# Patient Record
Sex: Male | Born: 1945 | Race: White | Hispanic: No | Marital: Single | Smoking: Former smoker
Health system: Southern US, Community
[De-identification: ages and names within clinical notes are randomized; demographics above are authoritative.]

## PROBLEM LIST (undated history)

## (undated) DIAGNOSIS — I779 Disorder of arteries and arterioles, unspecified: Secondary | ICD-10-CM

## (undated) DIAGNOSIS — Z923 Personal history of irradiation: Secondary | ICD-10-CM

## (undated) DIAGNOSIS — I714 Abdominal aortic aneurysm, without rupture, unspecified: Secondary | ICD-10-CM

## (undated) DIAGNOSIS — C61 Malignant neoplasm of prostate: Secondary | ICD-10-CM

## (undated) DIAGNOSIS — I739 Peripheral vascular disease, unspecified: Secondary | ICD-10-CM

## (undated) DIAGNOSIS — Z9889 Other specified postprocedural states: Secondary | ICD-10-CM

## (undated) DIAGNOSIS — Z720 Tobacco use: Secondary | ICD-10-CM

## (undated) DIAGNOSIS — Z8679 Personal history of other diseases of the circulatory system: Secondary | ICD-10-CM

## (undated) DIAGNOSIS — Z8619 Personal history of other infectious and parasitic diseases: Secondary | ICD-10-CM

## (undated) DIAGNOSIS — Z9289 Personal history of other medical treatment: Secondary | ICD-10-CM

## (undated) DIAGNOSIS — I10 Essential (primary) hypertension: Secondary | ICD-10-CM

## (undated) DIAGNOSIS — E785 Hyperlipidemia, unspecified: Secondary | ICD-10-CM

## (undated) DIAGNOSIS — I2510 Atherosclerotic heart disease of native coronary artery without angina pectoris: Secondary | ICD-10-CM

## (undated) DIAGNOSIS — G709 Myoneural disorder, unspecified: Secondary | ICD-10-CM

## (undated) DIAGNOSIS — I219 Acute myocardial infarction, unspecified: Secondary | ICD-10-CM

## (undated) HISTORY — DX: Abdominal aortic aneurysm, without rupture, unspecified: I71.40

## (undated) HISTORY — DX: Myoneural disorder, unspecified: G70.9

## (undated) HISTORY — DX: Hyperlipidemia, unspecified: E78.5

## (undated) HISTORY — DX: Abdominal aortic aneurysm, without rupture: I71.4

## (undated) HISTORY — PX: OTHER SURGICAL HISTORY: SHX169

## (undated) HISTORY — PX: HERNIA REPAIR: SHX51

## (undated) HISTORY — DX: Personal history of other medical treatment: Z92.89

## (undated) HISTORY — PX: INGUINAL HERNIA REPAIR: SUR1180

## (undated) HISTORY — PX: CHOLECYSTECTOMY: SHX55

## (undated) HISTORY — DX: Malignant neoplasm of prostate: C61

## (undated) HISTORY — DX: Personal history of irradiation: Z92.3

## (undated) HISTORY — DX: Acute myocardial infarction, unspecified: I21.9

## (undated) HISTORY — DX: Essential (primary) hypertension: I10

## (undated) HISTORY — DX: Tobacco use: Z72.0

## (undated) HISTORY — DX: Atherosclerotic heart disease of native coronary artery without angina pectoris: I25.10

## (undated) HISTORY — PX: CAROTID ENDARTERECTOMY: SUR193

## (undated) HISTORY — DX: Disorder of arteries and arterioles, unspecified: I77.9

## (undated) HISTORY — DX: Peripheral vascular disease, unspecified: I73.9

## (undated) HISTORY — DX: Personal history of other diseases of the circulatory system: Z98.890

## (undated) HISTORY — DX: Personal history of other diseases of the circulatory system: Z86.79

---

## 1999-11-23 HISTORY — PX: CORONARY ARTERY BYPASS GRAFT: SHX141

## 2000-08-17 ENCOUNTER — Encounter: Payer: Self-pay | Admitting: *Deleted

## 2000-08-18 ENCOUNTER — Encounter: Payer: Self-pay | Admitting: *Deleted

## 2000-08-18 ENCOUNTER — Inpatient Hospital Stay (HOSPITAL_COMMUNITY): Admission: RE | Admit: 2000-08-18 | Discharge: 2000-08-29 | Payer: Self-pay | Admitting: *Deleted

## 2000-08-19 ENCOUNTER — Encounter: Payer: Self-pay | Admitting: Cardiothoracic Surgery

## 2000-08-20 ENCOUNTER — Encounter: Payer: Self-pay | Admitting: Cardiothoracic Surgery

## 2000-08-21 ENCOUNTER — Encounter: Payer: Self-pay | Admitting: Cardiothoracic Surgery

## 2000-08-22 ENCOUNTER — Encounter: Payer: Self-pay | Admitting: Cardiothoracic Surgery

## 2000-08-23 ENCOUNTER — Encounter: Payer: Self-pay | Admitting: Cardiothoracic Surgery

## 2000-08-24 ENCOUNTER — Encounter: Payer: Self-pay | Admitting: Cardiothoracic Surgery

## 2000-08-25 ENCOUNTER — Encounter: Payer: Self-pay | Admitting: Cardiothoracic Surgery

## 2000-08-26 ENCOUNTER — Encounter: Payer: Self-pay | Admitting: Surgery

## 2000-11-03 ENCOUNTER — Ambulatory Visit (HOSPITAL_COMMUNITY): Admission: RE | Admit: 2000-11-03 | Discharge: 2000-11-04 | Payer: Self-pay | Admitting: Cardiovascular Disease

## 2000-11-22 DIAGNOSIS — G709 Myoneural disorder, unspecified: Secondary | ICD-10-CM

## 2000-11-22 HISTORY — DX: Myoneural disorder, unspecified: G70.9

## 2001-04-24 ENCOUNTER — Ambulatory Visit (HOSPITAL_COMMUNITY): Admission: RE | Admit: 2001-04-24 | Discharge: 2001-04-24 | Payer: Self-pay | Admitting: *Deleted

## 2001-04-24 ENCOUNTER — Encounter: Payer: Self-pay | Admitting: *Deleted

## 2001-04-24 ENCOUNTER — Inpatient Hospital Stay (HOSPITAL_COMMUNITY): Admission: AD | Admit: 2001-04-24 | Discharge: 2001-04-29 | Payer: Self-pay | Admitting: Family Medicine

## 2001-04-24 ENCOUNTER — Encounter: Payer: Self-pay | Admitting: Family Medicine

## 2006-01-26 ENCOUNTER — Ambulatory Visit: Payer: Self-pay | Admitting: Internal Medicine

## 2006-01-26 ENCOUNTER — Ambulatory Visit (HOSPITAL_COMMUNITY): Admission: RE | Admit: 2006-01-26 | Discharge: 2006-01-26 | Payer: Self-pay | Admitting: Internal Medicine

## 2006-04-04 ENCOUNTER — Observation Stay (HOSPITAL_COMMUNITY): Admission: RE | Admit: 2006-04-04 | Discharge: 2006-04-05 | Payer: Self-pay | Admitting: General Surgery

## 2007-05-29 ENCOUNTER — Ambulatory Visit (HOSPITAL_COMMUNITY): Admission: RE | Admit: 2007-05-29 | Discharge: 2007-05-29 | Payer: Self-pay | Admitting: Cardiovascular Disease

## 2007-06-01 ENCOUNTER — Ambulatory Visit (HOSPITAL_COMMUNITY): Admission: RE | Admit: 2007-06-01 | Discharge: 2007-06-01 | Payer: Self-pay | Admitting: Cardiovascular Disease

## 2007-07-31 ENCOUNTER — Ambulatory Visit: Payer: Self-pay | Admitting: Surgery

## 2007-08-11 ENCOUNTER — Inpatient Hospital Stay (HOSPITAL_COMMUNITY): Admission: RE | Admit: 2007-08-11 | Discharge: 2007-08-13 | Payer: Self-pay | Admitting: Surgery

## 2007-08-11 ENCOUNTER — Ambulatory Visit: Payer: Self-pay | Admitting: Surgery

## 2007-08-11 ENCOUNTER — Encounter: Payer: Self-pay | Admitting: Surgery

## 2007-08-28 ENCOUNTER — Ambulatory Visit: Payer: Self-pay | Admitting: Surgery

## 2008-03-18 ENCOUNTER — Ambulatory Visit: Payer: Self-pay | Admitting: Surgery

## 2010-01-26 DIAGNOSIS — Z9289 Personal history of other medical treatment: Secondary | ICD-10-CM

## 2010-01-26 HISTORY — DX: Personal history of other medical treatment: Z92.89

## 2010-07-29 ENCOUNTER — Ambulatory Visit (HOSPITAL_COMMUNITY): Admission: RE | Admit: 2010-07-29 | Discharge: 2010-07-29 | Payer: Self-pay | Admitting: Family Medicine

## 2010-08-05 ENCOUNTER — Encounter: Admission: RE | Admit: 2010-08-05 | Discharge: 2010-08-05 | Payer: Self-pay | Admitting: Family Medicine

## 2010-08-05 ENCOUNTER — Ambulatory Visit (HOSPITAL_COMMUNITY): Admission: RE | Admit: 2010-08-05 | Discharge: 2010-08-05 | Payer: Self-pay | Admitting: Family Medicine

## 2010-08-13 ENCOUNTER — Ambulatory Visit (HOSPITAL_COMMUNITY)
Admission: RE | Admit: 2010-08-13 | Discharge: 2010-08-13 | Payer: Self-pay | Source: Home / Self Care | Admitting: Family Medicine

## 2010-10-05 ENCOUNTER — Ambulatory Visit: Payer: Self-pay | Admitting: Surgery

## 2010-10-27 DIAGNOSIS — Z9289 Personal history of other medical treatment: Secondary | ICD-10-CM

## 2010-10-27 HISTORY — DX: Personal history of other medical treatment: Z92.89

## 2010-11-22 DIAGNOSIS — C61 Malignant neoplasm of prostate: Secondary | ICD-10-CM

## 2010-11-22 HISTORY — DX: Malignant neoplasm of prostate: C61

## 2010-12-15 ENCOUNTER — Ambulatory Visit
Admission: RE | Admit: 2010-12-15 | Discharge: 2010-12-22 | Payer: Self-pay | Source: Home / Self Care | Attending: Radiation Oncology | Admitting: Radiation Oncology

## 2011-01-04 ENCOUNTER — Ambulatory Visit: Payer: 59 | Attending: Radiation Oncology | Admitting: Radiation Oncology

## 2011-01-04 DIAGNOSIS — Z510 Encounter for antineoplastic radiation therapy: Secondary | ICD-10-CM | POA: Insufficient documentation

## 2011-01-04 DIAGNOSIS — R300 Dysuria: Secondary | ICD-10-CM | POA: Insufficient documentation

## 2011-01-04 DIAGNOSIS — R351 Nocturia: Secondary | ICD-10-CM | POA: Insufficient documentation

## 2011-01-04 DIAGNOSIS — R350 Frequency of micturition: Secondary | ICD-10-CM | POA: Insufficient documentation

## 2011-01-04 DIAGNOSIS — C61 Malignant neoplasm of prostate: Secondary | ICD-10-CM | POA: Insufficient documentation

## 2011-01-14 ENCOUNTER — Encounter (INDEPENDENT_AMBULATORY_CARE_PROVIDER_SITE_OTHER): Payer: Self-pay | Admitting: *Deleted

## 2011-01-19 NOTE — Letter (Signed)
Summary: Recall, Screening Colonoscopy Only  [facility]  [address]   [place], [place] [address]   Phone: [phone]  Fax: [phone]    January 14, 2011  Jim [name] [address] [place], [place]  [address] April 24, 1946   Dear Mr. Gutzwiller,   Our records indicate it is time to schedule your colonoscopy.  Please call our office at [phone] and ask for the nurse.   Thank you, Hendricks Limes, LPN Cloria Spring, LPN  [facility] Ph: [phone]   Fax: [phone]

## 2011-03-04 ENCOUNTER — Other Ambulatory Visit: Payer: Self-pay | Admitting: Surgery

## 2011-03-04 DIAGNOSIS — I714 Abdominal aortic aneurysm, without rupture: Secondary | ICD-10-CM

## 2011-04-05 ENCOUNTER — Ambulatory Visit
Admission: RE | Admit: 2011-04-05 | Discharge: 2011-04-05 | Disposition: A | Discharge status: Home / Self Care | Payer: 59 | Source: Ambulatory Visit | Attending: Surgery | Admitting: Surgery

## 2011-04-05 ENCOUNTER — Ambulatory Visit: Payer: 59 | Admitting: Surgery

## 2011-04-05 DIAGNOSIS — I714 Abdominal aortic aneurysm, without rupture: Secondary | ICD-10-CM

## 2011-04-05 MED ORDER — IOHEXOL 350 MG/ML SOLN
100.0000 mL | Freq: Once | INTRAVENOUS | Status: AC | PRN
  Administered 2011-04-05: 100 mL via INTRAVENOUS

## 2011-04-06 NOTE — Assessment & Plan Note (Signed)
OFFICE VISIT   Jim [name], Jim [name]  DOB:  12/18/1945                                       10/05/2010  CHART#:[id number]   REASON FOR VISIT:  Abdominal aortic aneurysm.   PRIMARY CARE PHYSICIAN:  Dr. Gerda Diss.   CARDIOLOGIST:  Dr. Nanetta Batty.   HISTORY:  The patient is a 65 year old gentleman well-known to me having  undergone right carotid endarterectomy in September of 2009.  He is  being followed with serial ultrasounds at [facility] by Dr. Allyson Sabal for  this.  He has a history of having had a right iliac stent placed for  claudication.  His claudication symptoms have been stable as have his  repeat ultrasounds.  The patient recently had back pain and underwent  MRI which revealed an aneurysm.  He had a CT angiogram which further  evaluated his aneurysm and found to be maximal diameter of 4.6.  The  patient denies having any abdominal pain.   The patient suffers from hypertension and hyperlipidemia, both of which  are medically managed.  He also suffers from coronary artery disease  having undergone coronary artery bypass grafting.   SOCIAL HISTORY:  He is single.  Does not smoke.  Has a history of  smoking but quit in 2002.  Does not drink alcohol.   FAMILY HISTORY:  Positive for heart attack in his father.   PAST MEDICAL HISTORY:  Hypertension, hypercholesterolemia, coronary  artery disease.   PAST SURGICAL HISTORY:  Inguinal hernia repair, cholecystectomy,  coronary artery bypass graft.   REVIEW OF SYSTEMS:  Negative except as documented in the HPI.   MEDICATIONS:  Include Plavix, aspirin, folic acid, Altace, Niaspan,  Lipitor, metoprolol.   ALLERGIES:  None.   PHYSICAL EXAMINATION:  Vital signs:  Heart rate 98, blood pressure  102/70, respiratory rate 16.  General:  He is well-appearing, in no  distress.  HEENT:  Within normal limits.  Lungs:  Clear bilaterally.  No  wheezes or rhonchi.  Cardiovascular:  Regular rate and rhythm.  No  carotid bruits.  Pedal pulses are not palpable.  Abdomen:  Soft,  nontender.  No splenomegaly.  Musculoskeletal:  Without major deformity.  Neurological:  No focal deficits.  Skin:  Without rash.   IMAGING:  I reviewed his CT scan, has a 4.6 cm infrarenal abdominal  aortic aneurysm.   ASSESSMENT:  Abdominal aortic aneurysm.   PLAN:  The patient's maximal diameter of his aneurysm is 4.6.  I would  not recommend repair at this time but rather serial followup.  I have  scheduled him to come back and see me in 6 months with a repeat CT scan.  At this size I think he is at low risk for rupture.  Also, based on his  CT scan I think he is a candidate for percutaneous repair.  He does have  an iliac stent which I do not think will cause a problem getting  delivering his device but it will be something that needs to be  addressed at a later date.     Jorge Ny, MD  Electronically Signed   VWB/MEDQ  D:  10/05/2010  T:  10/06/2010  Job:  [id number]   cc:   Nanetta Batty, M.D.  Scott A. Gerda Diss, MD

## 2011-04-06 NOTE — Procedures (Signed)
CAROTID DUPLEX EXAM   INDICATION:  Status post right CEA 2 weeks ago.  The patient is  asymptomatic.   HISTORY:  Diabetes:  No  Cardiac:  CABG in 2001  Hypertension:  Yes  Smoking:  Quit 08/11/2007  Previous Surgery:  Left CEA in 2001 and CABG 2001  CV History:  Amaurosis Fugax :  No, Paresthesias:  No, Hemiparesis:  No                                       RIGHT             LEFT  Brachial systolic pressure:         110               106  Brachial Doppler waveforms:         Within normal limits                Within normal limits  Vertebral direction of flow:        Antegrade         Antegrade  DUPLEX VELOCITIES (cm/sec)  CCA peak systolic                   85                64  ECA peak systolic                   305               54  ICA peak systolic                   51                59  ICA end diastolic                   20                18  PLAQUE MORPHOLOGY:                  N/A               Heterogeneous  PLAQUE AMOUNT:                      N/A               Mixed  PLAQUE LOCATION:                    N/A               Bifurcation/ICA   IMPRESSION:  1.  Patent right internal carotid artery status post carotid  endarterectomy without evidence of significant stenosis.  2. Left internal carotid artery status post carotid endarterectomy 1% to  39% stenosis with mild plaque.  3. Right external carotid artery stenosis.   ___________________________________________  V. Charlena Cross, M.D.   PB/MEDQ  D:  08/28/2007  T:  08/29/2007  Job:  [id number]

## 2011-04-06 NOTE — Assessment & Plan Note (Signed)
OFFICE VISIT   KERVENS, ROPER  DOB:  [birth year]                                       07/31/2007  CHART#:[id number]   REASON FOR REFERRAL:  Right carotid stenosis.   This is a 65 year old gentleman with significant vascular history who is  referred to me for evaluation of an asymptomatic right carotid stenosis.  The patient has a history of left carotid endarterectomy.  He has  undergone an arteriogram as well as duplex ultrasound, which confirmed  greater than 80% right carotid stenosis.  The patient is asymptomatic at  this time.  He denies amaurosis fugax.  He denies numbness or weakness  in either extremities.   PAST MEDICAL HISTORY:  Significant for hypercholesterolemia and  hypertension as well as coronary artery disease.   PAST SURGICAL HISTORY:  1. Coronary artery bypass graft.  2. Right iliac stent.  3. Left carotid endarterectomy.  4. Cholecystectomy.   FAMILY HISTORY:  Significant for vascular disease in his father.   SOCIAL HISTORY:  He is single.  He does not smoke; he quit 2 months ago.  He does not drink alcohol.   REVIEW OF SYSTEMS:  GENERAL:  His weight is 170 pounds.  CARDIAC:  He denies chest pain.  PULMONARY:  He denies shortness of breath.  GASTROINTESTINAL:  He denies black stools, peptic ulcer disease, reflux  disease.  GENITOURINARY:  He denies dysuria.  VASCULAR:  Denies claudication or TIAs.  NEUROLOGIC:  Denies dizziness, blackouts, headaches, or seizures.  ORTHO:  Denies arthritis, joint pain, muscle pain or rash.  PSYCHIATRIC:  No depression.  ENT:  No change in eyesight, no change in hearing.  HEMATOLOGIC:  No clotting problems.   ALLERGIES:  No known drug allergies.   MEDICATIONS:  1. Folic acid 400 mcg once daily.  2. Famotidine 20 mg once daily.  3. Altace 2.5 mg once daily.  4. Toprol-XL 50 mg once daily.  5. Baby aspirin 81 mg once daily.  6. Plavix 75 mg once daily.  7. Lipitor 80 mg once  daily.   PHYSICAL EXAMINATION:  Vital signs:  Heart rate 86, blood pressure  107/67 on the left and 105/67 on the right.  General:  In no acute  distress.  Well appearing.  HEENT:  Positive right carotid bruit.  Cardiac:  Regular rate and rhythm.  Pulmonary:  Clear to auscultation  bilaterally.  Abdomen:  Soft, nontender.  Extremities:  Warm and well  perfused.  Neurologic:  Cranial nerves 2-12 are grossly intact.  Normal  strength and sensation bilaterally.   DIAGNOSTIC STUDIES:  Are reviewed, his ultrasound and angiographic  results, which reveal high-grade, proximal internal carotid stenosis on  the right.   ASSESSMENT:  Asymptomatic right carotid stenosis in the setting of  history of left carotid endarterectomy.   PLAN:  At this point in time we discussed medical management versus  operative management.  The patient desires to proceed with the  operation.  Given that he has had his left carotid repaired, I am  sending him to Dr. Annalee Genta, of ENT, who will evaluate his cords.  This  will be done tomorrow, on September 11th.  Subsequently, the patient has  been scheduled for a right carotid endarterectomy on Friday, 08/11/2007.  He has been told to stop his Plavix on this coming Saturday.   Risks and  benefits of performing the operation which include nerve  injury, stroke, bleeding, infection, cardiac issues, have been discussed  with the patient, and he is willing to proceed.   Jorge Ny, MD  Electronically Signed   VWB/MEDQ  D:  07/31/2007  T:  08/01/2007  Job:  [id number]

## 2011-04-06 NOTE — Cardiovascular Report (Signed)
NAME:  SHANT, HENCE NO.:  [id number]   MEDICAL RECORD NO.:  [id number]          PATIENT TYPE:  AMB   LOCATION:  SDS                          FACILITY:  [facility]   PHYSICIAN:  Nanetta Batty, M.D.   DATE OF BIRTH:  Feb 09, 1946   DATE OF PROCEDURE:  06/01/2007  DATE OF DISCHARGE:  06/01/2007                            CARDIAC CATHETERIZATION   INDICATION:  Mr. Luse is a very pleasant 65 year old white male with  a history of CAD, status post bypass grafting in 2001.  He had a carotid  endarterectomy at that time.  He has had a known small abdominal aortic  aneurysm, status post bilateral iliac artery stenting.  He has been  followed by duplex ultrasound in our office for his carotids and AAA.  A  recent Doppler performed on May 28 showed progression of disease on the  right with a patent left endarterectomy site.  He is neurologically  asymptomatic.  He presents now for outpatient angiography to delineate  his anatomy.   DESCRIPTION OF PROCEDURE:  The patient was brought to the 2nd floor  [facility] PV angiographic suite in the postabsorptive state.  He was  not premedicated.  His right groin was prepped and draped in the usual  fashion.  Xylocaine 1% was used for local anesthesia.  A 5-French sheath  was inserted into the right femoral artery using standard Seldinger  technique.  A 5-French tennis racket catheter was used for arch  angiography and distal abdominal aortography.  A JB1 catheter was used  for selective vertebral carotid angiography.  Visipaque dye was used for  the entirety of the case.  Retrograde aortic pressure was monitored  during the case.   ANGIOGRAPHIC RESULTS:  1. Arch aortogram:  Type 2 arch.  2. Abdominal aortogram:      a.     Renal arteries - 30% bilateral renal artery stenosis.      b.     Small infrarenal abdominal aortic aneurysm.      c.     Patent right common iliac artery stent.  3. Right and left vertebral:  Widely patent  with normal intra and      extracranial anatomy.  4. Right carotid:      a.     Ninety percent proximal right common iliac artery stenosis       with normal intracerebral anatomy.  5. Left carotid:      a.     Patent left carotid endarterectomy site with normal       intracranial anatomy.   IMPRESSION:  Mr. Leib has a patent left carotid endarterectomy site  with a high-grade right internal carotid artery stenosis.  He is  neurologically asymptomatic.  He had a negative Myoview 4 months ago.  He is a good  candidate for endarterectomy.  He will be discharged home later today as  an outpatient and we will see me back in the office next week.  I will  arrange followup with the vascular surgeon who did his initial  endarterectomy.  He left the lab in stable condition.  Nanetta Batty, M.D.  Electronically Signed     JB/MEDQ  D:  06/01/2007  T:  06/02/2007  Job:  [id number]   cc:   [facility] 2nd floor PV Angiographic Suite  [address] [place], [place] [address] [facility]  Scott A. Gerda Diss, MD

## 2011-04-06 NOTE — Letter (Signed)
July 31, 2007   Nanetta Batty, M.D.  [phone], [address]  [place], [place] [address]   Re:  Jim [name], SERVISS                  DOB:  18-Apr-1946   Dear Dr. Allyson Sabal,   I had the pleasure of seeing Jim [name] today in the clinic.  As  you know, he is a 65 year old gentleman with a history of left carotid  endarterectomy who now has right carotid stenosis.  He is asymptomatic  at this time.  You performed an ultrasound as well as an angiogram,  which confirmed this findings.   I am scheduling him to have his right carotid repaired on Friday,  September 19th.  He is getting scoped by Dr. Annalee Genta of ENT tomorrow  to evaluate his cords.  He is on Plavix, but I am going to stop this on  Saturday.   Thanks again for letting me take care of this patient.   Jorge Ny, MD  Electronically Signed   VWB/MEDQ  D:  07/31/2007  T:  08/01/2007  Job:  [id number]

## 2011-04-06 NOTE — Assessment & Plan Note (Signed)
OFFICE VISIT   Jim [name], Jim [name]  DOB:  Sep 13, 1946                                       03/18/2008  CHART#:[id number]   REASON FOR VISIT:  Followup.   HISTORY:  This is a 65 year old gentleman whom I initially saw in  September for asymptomatic right carotid stenosis.  The patient has a  history of a left carotid endarterectomy.  He underwent right carotid  endarterectomy on September 19.  His postoperative course was  uncomplicated.  He comes back in today for a 87-month visit.  He states  that his swallowing, which was the problem initially, has improved and  he has no neurologic symptoms at this time.   PHYSICAL EXAM:  Blood pressure 109/65, pulse 85, respirations 16.  In  general, well-appearing, in no acute distress.  The incision is well-  healed.  Neurologically he is intact.   DIAGNOSTIC STUDIES:  The patient's duplex was repeated today.  This  showed a 60-79% stenosis within the mid right carotid artery and 20-39%  stenosis of the left.   ASSESSMENT AND PLAN:  Status post bilateral carotid endarterectomy.   PLAN:  I will see the patient back in 6 months.  He is on our carotid  ultrasound protocol.  Will need to keep an eye on the stenosis on the  right side.  He will follow up with me in 6 months.   Jorge Ny, MD  Electronically Signed   VWB/MEDQ  D:  03/18/2008  T:  03/19/2008  Job:  [id number]   cc:   Nanetta Batty, M.D.

## 2011-04-06 NOTE — Assessment & Plan Note (Signed)
OFFICE VISIT   Jim [name], Jim [name]  DOB:  Apr 15, 1946                                       08/28/2007  CHART#:[id number]   REASON FOR VISIT:  Follow up carotid endarterectomy.   HISTORY:  This is a 65 year old gentleman who is status post right  carotid endarterectomy on 08/11/2007 for asymptomatic stenosis.  The  patient has a history of left carotid endarterectomy.  The patient's  postoperative course was uncomplicated.  He comes back today for  followup.   PHYSICAL EXAMINATION:  Respirations 16, heart rate 89, blood pressure  129/81.  General:  Well-appearing.  No acute distress.  His incision is  well-healed.  There is a small residual healing ridge.  He is  neurologically intact.   DIAGNOSTIC STUDIES:  The patient underwent duplex today which shows a  widely-patent right carotid endarterectomy without significant stenoses.  There is a right external stenosis.  The left side is also widely  patent.  There is right external carotid stenosis.   ASSESSMENT:  Doing well status post right carotid endarterectomy.   PLAN:  The patient will follow up in 6 months, at which point in time we  will repeat his ultrasound.  He does describe symptoms of trouble with  swallowing, however, he states that this is very similar to the problem  he had after his left side was done, which resolved with time and was  associated with numbness from his incision.  The patient does not give  symptoms of aspiration or coughing with his swallowing.  It just takes  him a long time to chew his food.  This  does not sound like a nerve injury and ,therefore, we expect it to get  better and reevaluate him in 6 months.   Jorge Ny, MD  Electronically Signed   VWB/MEDQ  D:  08/28/2007  T:  08/29/2007  Job:  [id number]

## 2011-04-06 NOTE — Discharge Summary (Signed)
NAME:  Jim [name], Jim [name]            ACCOUNT NO.:  [id number]   MEDICAL RECORD NO.:  [id number]          PATIENT TYPE:  INP   LOCATION:  3302                         FACILITY:  [facility]   PHYSICIAN:  Juleen China IV, MDDATE OF BIRTH:  10-14-1946   DATE OF ADMISSION:  08/11/2007  DATE OF DISCHARGE:  08/13/2007                               DISCHARGE SUMMARY   PROCEDURE:  Right carotid endarterectomy on August 11, 2007.   HOSPITAL COURSE:  This is a 65 year old gentleman who presented on the  day of admission for asymptomatic right carotid endarterectomy.  The  patient tolerated the procedure well.  He was neurologically intact  following his operation.  He had issues with blood pressure control  requiring dopamine for a day and a half.  This was weaned off the night  before his discharge.  At this time, he is doing well and not requiring  pain medication.  He is able to ambulate.  He will be discharged to  home.   DISCHARGE MEDICATIONS:  His discharge medications include:  1. Folic acid 400 mg daily.  2. Famotidine 20 mg daily.  3. Altace 2.5 mg daily.  4. Toprol XL 50 mg daily.  5. Aspirin 81 pr day.  6. Plavix 75 mg p.o. daily.  7. Lipitor 80 mg p.o. daily.  8. Niaspan 100 mg per day.   DISCHARGE INSTRUCTIONS:  He is instructed to call if he has numbness or  weakness in either extremity as well as change in vision, signs  consistent with TIA.  Otherwise he will follow-up with me in two weeks  at the office with an ultrasound.      Jorge Ny, MD  Electronically Signed     VWB/MEDQ  D:  08/13/2007  T:  08/14/2007  Job:  [id number]

## 2011-04-06 NOTE — Procedures (Signed)
CAROTID DUPLEX EXAM   INDICATION:  Follow-up evaluation of known carotid artery disease.   HISTORY:  Diabetes:  No.  Cardiac:  Coronary artery bypass graft in 2001.  Hypertension:  No.  Smoking:  Quit last year.  Previous Surgery:  Right carotid endarterectomy with Dacron patch  angioplasty on 08/11/07.  Left carotid endarterectomy between 6-7 years  ago.  CV History:  Previous duplex performed on 08/28/07 revealed no right ICA  stenosis, status post endarterectomy and 20-39% left ICA stenosis.  Patient reports no cerebrovascular symptoms at this time.  Amaurosis Fugax No, Paresthesias No, Hemiparesis No                                       RIGHT             LEFT  Brachial systolic pressure:         110               110  Brachial Doppler waveforms:         Triphasic         Triphasic  Vertebral direction of flow:        Antegrade         Antegrade  DUPLEX VELOCITIES (cm/sec)  CCA peak systolic                   97                79  ECA peak systolic                   143               69  ICA peak systolic                   206               48  ICA end diastolic                   78                14  PLAQUE MORPHOLOGY:                  Soft              Soft  PLAQUE AMOUNT:                      Moderate          Mild  PLAQUE LOCATION:                    Distal endarterectomy site (ICA)    Proximal and distal  endarterectomy sites.   IMPRESSION:  1. 60-79% mid right internal carotid artery stenosis (distal Dacron      patch).  2. 20-39% left internal carotid artery stenosis, status post      endarterectomy.   ___________________________________________  V. Charlena Cross, MD   MC/MEDQ  D:  03/18/2008  T:  03/18/2008  Job:  [id number]

## 2011-04-06 NOTE — Op Note (Signed)
NAME:  Jim [name], Jim [name]            ACCOUNT NO.:  [id number]   MEDICAL RECORD NO.:  [id number]          PATIENT TYPE:  INP   LOCATION:  3302                         FACILITY:  [facility]   PHYSICIAN:  Juleen China IV, MDDATE OF BIRTH:  1946-11-12   DATE OF PROCEDURE:  08/11/2007  DATE OF DISCHARGE:                               OPERATIVE REPORT   PREOPERATIVE DIAGNOSIS:  Asymptomatic right carotid stenosis.   POSTOPERATIVE DIAGNOSIS:  Asymptomatic right carotid stenosis.   PROCEDURE PERFORMED:  Right carotid endarterectomy.   Co-surgeon for this procedure was [employer].   TYPE OF ANESTHESIA:  General.   BLOOD LOSS:  150 mL.   FINDINGS:  Ninety percent stenosis.   INDICATIONS FOR PROCEDURE:  Mr. Eckert is a 65 year old gentleman who  has previously undergone left carotid endarterectomy and in routine  surveillance was found to have 80-99% stenosed duplex.  This was  subsequently followed with a carotid angiogram, which confirmed the  duplex.  The patient has been scheduled for right carotid  endarterectomy.  He was preoperatively evaluated.  He preoperatively had  his cords evaluated and they were to be of normal function.  The risks  and benefits of the procedure were discussed with the patient and  informed consent was signed.  He was willing to proceed.   PROCEDURE:  The patient was identified in the holding area and taken to  room 9.  He was placed supine on the table.  General endotracheal  anesthesia was administered.  The patient was then prepped and draped in  the standard sterile fashion.  A timeout was called.  Perioperative  antibiotics were administered.  An incision along the anterior border of  the sternocleidomastoid was made with a #10 blade.  Bovie cautery was  used to dissect the subcutaneous tissue.  The platysma muscles divided  with cautery.  The internal jugular vein was identified and reflected  laterally.  Common facial vein was identified and  divided between 2-0  silk ties. The common carotid artery was then exposed and then  circumferentially isolated so that an umbilical tape could be passed.  Dissection was proceeded cephalad.  The superior thyroid artery was  identified and encircled with a 2-0 silk tie.  Similarly, the external  carotid artery was isolated and encircled with a red vessel loop.  Next,  attention was turned towards the internal carotid.  The plaque was  easily identified at the carotid bifurcation, extending only minimally  into the internal carotid.  Distance was then obtained.  Adequate  distance distal dissection on the internal carotid was obtained such  that we were well past the plaque.  Once this was done, the patient was  systematically heparinized.  Next, a peripheral DeBakey clamp was placed  on the common carotid and Gregory clamp on the internal carotid and the  vessel loops and suture were tightened on the external and superior  thyroid arteries.  A #11 blade was used to make an arteriotomy in the  common carotid.  Potts scissors were used to extend the arteriotomy up  on the internal carotid artery.  At this  point in time, an Arg[place] shunt  was placed.  Endarterectomy was performed with the [facility].  The tissue embolic debris was removed.  Heparinized saline was then used  to irrigate and endarterectomize the plane.  The distal flap was  adherent.  Next, a bovine pericardial patch was selected.  This was  anastomosed to the artery using a running 6-0 Prolene.  Prior to  completion of the anastomosis, the shunt was removed.  The internal  carotid artery was allowed to backbleed.  The external and common  carotid were forward flushed with the internal occluded.  Next, the  artery was filled with heparinized saline and the remaining sutures were  placed.  Next, the clamp was released off the external carotid artery  followed by the common carotid artery for several cardiac cycles.   Ultimately the internal carotid was released.  One repair stitch was  required.  There was an audible Doppler signal distal to the patch with  good diastolic flow.  Next, hemostasis was achieved.  A 15-French drain  was placed and brought up through the lateral neck and secured with a 3-  0 nylon.  The fascia to the sternocleidomastoid was reapproximated.  The  platysma muscle was reapproximated with interrupted 3-0 Vicryl.  A 4-0  Vicryl was used to close the skin.  Dermabond was placed.  The patient  was successfully extubated and found to be moving all 4 extremities to  command as well as able to stick his tongue out which was in the  midline.  He was then taken to the recovery room in stable condition.      Jorge Ny, MD  Electronically Signed     VWB/MEDQ  D:  08/11/2007  T:  08/11/2007  Job:  [id number]

## 2011-04-09 NOTE — Consult Note (Signed)
[facility]  Patient:    Jim [name], Jim [name]                   MRN: [id number] Proc. Date: 08/17/00 Adm. Date:  [id number] Attending:  Lenise Herald H CC:         Lenise Herald, M.D.  Gwenith Daily Tyrone Sage, M.D.   Consultation Report  REFERRING PHYSICIANS:  Lenise Herald, M.D., Gwenith Daily. Tyrone Sage, M.D.  REASON FOR CONSULTATION:  Extracranial cerebrovascular occlusive disease.  HISTORY OF PRESENT ILLNESS:  This is a 66 year old male employee of [employer], who underwent elective cardiac catheterization today, carried out by Dr. Lenise Herald.  The results of this revealed evidence of severe three-vessel coronary artery disease, and the patient was admitted to the hospital for further management, including cardiothoracic surgery consultation.  The patients history dates back to an initial presentation of bilateral lower extremity claudication, more severely affecting his right leg than his left. The patient developed calf discomfort with ambulation.  He underwent a Cardiolite evaluation revealing evidence of circumflex distribution ischemia.  The patient does describe shortness of breath with exertion.  He denies resting shortness of breath.  No diaphoresis, chest pain, or chest tightness.  He has noticed increasing fatigue over approximately three weeks.  No history of myocardial infarction.  No previous history of cardiac or vascular surgery.  PAST MEDICAL HISTORY:  The patient has borderline hypertension.  Denies cerebrovascular disease.  Denies previous documented history of coronary artery disease.  No history of diabetes.  SOCIAL HISTORY:  The patient lives alone.  He has one living sister and father.  He is not married.  Has no children.  He is employed at [employer] in [place].  PAST SURGICAL HISTORY:  Bilateral inguinal hernia repair and umbilical hernia repair.  MEDICATIONS:  Medications prior to admission:   Pletal 100 mg p.o. b.i.d., Toprol XL 25 mg daily, and ASA 325 mg daily.  ALLERGIES:  None known.  REVIEW OF SYSTEMS:  The patient denies sensory, motor, or visual disturbance. No dizziness.  Denies syncope.  No headache.  Does have shortness of breath with exertion.  Denies cough or sputum production.  No nausea, vomiting, constipation, or diarrhea.  Denies anorexia.  No recent weight loss.  No dysuria, frequency or hematuria.  PHYSICAL EXAMINATION:  GENERAL:  This reveals a 65 year old male who appears approximately his stated age.  He is alert and oriented and in no distress.  No obesity.  HEENT:  Normocephalic.  Mouth and throat are clear.  Pupils equal and responsive to light.  NECK:  No thyromegaly or adenopathy.  Faint right carotid bruit, no left carotid bruit.  CHEST:  Air entry equal bilaterally.  No rales or rhonchi.  CARDIAC:  Heart sounds normal without extra sounds or murmurs.  ABDOMEN:  Soft and nontender.  No organomegaly or masses felt.  No abdominal bruits.  EXTREMITIES:  2+ femoral pulses bilaterally.  No palpable popliteal, posterior tibial, or dorsalis pedis pulses bilaterally.  No peripheral edema.  NEUROLOGIC:  Patient alert and oriented.  Pupils equal and responsive to light.  Cranial nerves intact.  Strength equal bilaterally.  2+ reflexes. Sensation normal.  INVESTIGATIONS:  Carotid Doppler evaluation reveals evidence of a severe homogeneous plaque at the left carotid bifurcation, minimal calcified plaque at the right carotid bifurcation.  There is marked reduction in flow velocity in the left internal carotid artery consistent with a pre-occlusive severe stenosis.  Right internal carotid artery reveals velocities within normal  limits.  IMPRESSION:  A 65 year old male with severe three-vessel coronary artery occlusive disease.  Also evidence by Doppler evaluation of severe left internal carotid artery stenosis.  RECOMMENDATION:  Prior to  coronary artery bypass, I feel this patient should undergo cerebral arteriography to further evaluate the extent of his extracranial cerebrovascular occlusive disease.  This will be scheduled for tomorrow. DD:  08/17/00 TD:  08/18/00 Job: [id number]

## 2011-04-12 ENCOUNTER — Ambulatory Visit
Admission: RE | Admit: 2011-04-12 | Discharge: 2011-04-12 | Disposition: A | Discharge status: Home / Self Care | Payer: 59 | Source: Ambulatory Visit | Attending: Radiation Oncology | Admitting: Radiation Oncology

## 2011-05-10 ENCOUNTER — Ambulatory Visit (INDEPENDENT_AMBULATORY_CARE_PROVIDER_SITE_OTHER): Payer: 59 | Admitting: Surgery

## 2011-05-10 DIAGNOSIS — I714 Abdominal aortic aneurysm, without rupture: Secondary | ICD-10-CM

## 2011-05-10 NOTE — Assessment & Plan Note (Signed)
OFFICE VISIT  JSIAH, MENTA DOB:  1946-04-22                                       05/10/2011 CHART#:[id number]  REASON FOR VISIT:  Follow up aneurysm.  HISTORY:  This is a 65 year old gentleman who is status post right carotid endarterectomy by me in 2009 followed with serial ultrasounds by Dr. Allyson Sabal at [facility].  He has a history of right common femoral and left external iliac stenting in the past for claudication.  He recently underwent 6 months ago an MRI for back pain which showed a 4.6 cm aneurysm.  He is asymptomatic.  We have been following him.  He comes back in today with a CT scan.  He denies abdominal pain or leg pain.  He denies any neurologic symptoms such as amaurosis fugax, numbness, weakness or slurred speech.  PHYSICAL EXAMINATION:  Vital signs:  Heart rate 81, blood pressure 119/82, O2 sat is 99%.  General:  He is well-appearing, in no distress. HEENT:  Within normal limits.  Neurological:  He is intact without deficits.  Abdomen:  Soft, nontender.  Skin:  Without rash.  DIAGNOSTIC STUDIES:  I reviewed his CT scan.  His aneurysm measures 4.8 cm.  ASSESSMENT:  Asymptomatic infrarenal aneurysm.  PLAN:  We discussed continuing to observe this until it gets to be greater than 5 cm.  At that time, I would like to proceed with a formal abdominal angiogram to determine whether or not I will be able to be to get the sheaths across his stents.  On further inspection of the CT scan the external iliac stent on the left looks like it may be difficult to get an 18 [place] sheath past this area.  Hopefully an angiogram will help me make that decision.  Again will wait to do that until it is greater than 5 cm.  He understands that he is at very low risk for rupture but that it can occur.  I will plan on seeing him back in 6 months.    Jorge Ny, MD Electronically Signed  VWB/MEDQ  D:  05/10/2011  T:  05/10/2011  Job:   [id number]  cc:   Lorin Picket A. Gerda Diss, MD Nanetta Batty, M.D.

## 2011-06-15 ENCOUNTER — Telehealth: Payer: Self-pay | Admitting: Internal Medicine

## 2011-06-16 NOTE — Telephone Encounter (Signed)
LMOM for pt that I do not have the Sept schedule yet. When I get it I will give him a call and triage and schedule.

## 2011-07-13 NOTE — Telephone Encounter (Signed)
LMOM for pt to call. He will need OV prior to colonoscopy. Has Hx of adenomatous polyps.

## 2011-07-19 ENCOUNTER — Encounter: Payer: Self-pay | Admitting: Gastroenterology

## 2011-07-19 ENCOUNTER — Ambulatory Visit (INDEPENDENT_AMBULATORY_CARE_PROVIDER_SITE_OTHER): Payer: 59 | Admitting: Gastroenterology

## 2011-07-19 ENCOUNTER — Other Ambulatory Visit: Payer: Self-pay | Admitting: Gastroenterology

## 2011-07-19 VITALS — BP 114/71 | HR 92 | Temp 97.2°F | Ht 73.0 in | Wt 163.8 lb

## 2011-07-19 DIAGNOSIS — Z86010 Personal history of colonic polyps: Secondary | ICD-10-CM

## 2011-07-19 NOTE — Progress Notes (Signed)
Primary Care Physician:  Lilyan Punt, MD, MD  Primary Gastroenterologist:  Roetta Sessions, MD   Chief Complaint  Patient presents with  . Colonoscopy    HPI:  LYELL CLUGSTON is a 65 y.o. male here to schedule surveillance colonoscopy. He has a history of adenomatous polyps on colonoscopy back in 2000. Last colonoscopy in 2007, no polyps. Denies constipation, diarrhea, melena, rectal bleeding, abdominal pain, vomiting, heartburn, dysphagia.  Current Outpatient Prescriptions  Medication Sig Dispense Refill  . aspirin 81 MG tablet Take 81 mg by mouth daily.        Marland Kitchen atorvastatin (LIPITOR) 80 MG tablet Take 80 mg by mouth daily.        . clopidogrel (PLAVIX) 75 MG tablet Take 75 mg by mouth daily.        . folic acid (FOLVITE) 800 MCG tablet Take 400 mcg by mouth daily.        . metoprolol tartrate (LOPRESSOR) 25 MG tablet Take 25 mg by mouth daily. 1 1/2 tablets daily       . niacin (NIASPAN) 1000 MG CR tablet Take 1,000 mg by mouth at bedtime.        . ramipril (ALTACE) 2.5 MG tablet Take 2.5 mg by mouth daily.          Allergies as of 07/19/2011  . (No Known Allergies)    Past Medical History  Diagnosis Date  . Prostate cancer 2012    XRT, seed implants  . Carotid artery disease   . PVD (peripheral vascular disease)        . CAD (coronary artery disease)         . AAA (abdominal aortic aneurysm)     Infrarenal abdominal aortic aneurysm of 48 mm, ct 5/12, followed by Dr. Nanetta Batty.    Past Surgical History  Procedure Date  . Inguinal hernia repair     bilateral  . Hernia repair     periumbilical  . Coronary artery bypass graft 2001  . Gallbladder surgery   . Bilateral lower extremity stents     right common femoral and left external iliac  . Carotid endarterectomy     bilateral, right in 2009 and left in 2001  . Colonoscopy 3/07    diverticulosis, left-sided and internal hemorrhoids    Family History  Problem Relation Age of Onset  . Prostate cancer  Father   . Cancer Mother     ?    History   Social History  . Marital Status: Single    Spouse Name: N/A    Number of Children: 0  . Years of Education: N/A   Occupational History  .  [employer]   Social History Main Topics  . Smoking status: Former Smoker -- 1.0 packs/day    Types: Cigarettes  . Smokeless tobacco: Not on file   Comment: quit 10 years ago  . Alcohol Use: No  . Drug Use: No  . Sexually Active: Not on file   Other Topics Concern  . Not on file   Social History Narrative  . No narrative on file      ROS:  General: Negative for anorexia, weight loss, fever, chills, fatigue, weakness. Eyes: Negative for vision changes.  ENT: Negative for hoarseness, difficulty swallowing , nasal congestion. CV: Negative for chest pain, angina, palpitations, dyspnea on exertion, peripheral edema.  Respiratory: Negative for dyspnea at rest, dyspnea on exertion, cough, sputum, wheezing.  GI: See history of present illness. GU:  Negative for  dysuria, hematuria, urinary incontinence, urinary frequency, nocturnal urination.  MS: Negative for joint pain, low back pain.  Derm: Negative for rash or itching.  Neuro: Negative for weakness, abnormal sensation, seizure, frequent headaches, memory loss, confusion.  Psych: Negative for anxiety, depression, suicidal ideation, hallucinations.  Endo: Negative for unusual weight change.  Heme: Negative for bruising or bleeding. Allergy: Negative for rash or hives.    Physical Examination:  BP 114/71  Pulse 92  Temp(Src) 97.2 F (36.2 C) (Temporal)  Ht 6\' 1"  (1.854 m)  Wt 163 lb 12.8 oz (74.299 kg)  BMI 21.61 kg/m2   General: Well-nourished, well-developed in no acute distress.  Head: Normocephalic, atraumatic.   Eyes: Conjunctiva pink, no icterus. Mouth: Oropharyngeal mucosa moist and pink , no lesions erythema or exudate. Neck: Supple without thyromegaly, masses, or lymphadenopathy.  Lungs: Clear to auscultation  bilaterally.  Heart: Regular rate and rhythm, no murmurs rubs or gallops.  Abdomen: Bowel sounds are normal, nontender, nondistended, no hepatosplenomegaly or masses, no abdominal bruits or hernia , no rebound or guarding.   Rectal: Deferred to time of colonoscopy. Extremities: No lower extremity edema. No clubbing or deformities.  Neuro: Alert and oriented x 4 , grossly normal neurologically.  Skin: Warm and dry, no rash or jaundice.   Psych: Alert and cooperative, normal mood and affect.

## 2011-07-19 NOTE — Progress Notes (Signed)
Cc to PCP 

## 2011-07-19 NOTE — Assessment & Plan Note (Signed)
Due for surveillance colonoscopy.  I have discussed the risks, alternatives, benefits with regards to but not limited to the risk of reaction to medication, bleeding, infection, perforation and the patient is agreeable to proceed. Written consent to be obtained.  

## 2011-08-06 MED ORDER — SODIUM CHLORIDE 0.45 % IV SOLN
Freq: Once | INTRAVENOUS | Status: AC
  Administered 2011-08-09: 09:00:00 via INTRAVENOUS

## 2011-08-09 ENCOUNTER — Ambulatory Visit (HOSPITAL_COMMUNITY)
Admission: RE | Admit: 2011-08-09 | Discharge: 2011-08-09 | Disposition: A | Discharge status: Home / Self Care | Payer: 59 | Source: Ambulatory Visit | Attending: Internal Medicine | Admitting: Internal Medicine

## 2011-08-09 ENCOUNTER — Encounter (HOSPITAL_COMMUNITY): Payer: Self-pay | Admitting: *Deleted

## 2011-08-09 ENCOUNTER — Encounter (HOSPITAL_COMMUNITY)
Admission: RE | Disposition: A | Discharge status: Home / Self Care | Payer: Self-pay | Source: Ambulatory Visit | Attending: Internal Medicine

## 2011-08-09 ENCOUNTER — Other Ambulatory Visit: Payer: Self-pay | Admitting: Internal Medicine

## 2011-08-09 DIAGNOSIS — Z860100 Personal history of colon polyps, unspecified: Secondary | ICD-10-CM | POA: Insufficient documentation

## 2011-08-09 DIAGNOSIS — K5730 Diverticulosis of large intestine without perforation or abscess without bleeding: Secondary | ICD-10-CM | POA: Insufficient documentation

## 2011-08-09 DIAGNOSIS — D126 Benign neoplasm of colon, unspecified: Secondary | ICD-10-CM

## 2011-08-09 DIAGNOSIS — D129 Benign neoplasm of anus and anal canal: Secondary | ICD-10-CM | POA: Insufficient documentation

## 2011-08-09 DIAGNOSIS — Z1211 Encounter for screening for malignant neoplasm of colon: Secondary | ICD-10-CM

## 2011-08-09 DIAGNOSIS — Z09 Encounter for follow-up examination after completed treatment for conditions other than malignant neoplasm: Secondary | ICD-10-CM | POA: Insufficient documentation

## 2011-08-09 DIAGNOSIS — Z7982 Long term (current) use of aspirin: Secondary | ICD-10-CM | POA: Insufficient documentation

## 2011-08-09 DIAGNOSIS — D128 Benign neoplasm of rectum: Secondary | ICD-10-CM | POA: Insufficient documentation

## 2011-08-09 HISTORY — PX: COLONOSCOPY: SHX5424

## 2011-08-09 SURGERY — COLONOSCOPY
Anesthesia: Moderate Sedation

## 2011-08-09 MED ORDER — MEPERIDINE HCL 100 MG/ML IJ SOLN
INTRAMUSCULAR | Status: DC | PRN
  Administered 2011-08-09: 25 mg via INTRAVENOUS
  Administered 2011-08-09: 50 mg via INTRAVENOUS
  Administered 2011-08-09: 25 mg via INTRAVENOUS

## 2011-08-09 MED ORDER — MEPERIDINE HCL 100 MG/ML IJ SOLN
INTRAMUSCULAR | Status: AC
  Filled 2011-08-09: qty 2

## 2011-08-09 MED ORDER — MIDAZOLAM HCL 5 MG/5ML IJ SOLN
INTRAMUSCULAR | Status: AC
  Filled 2011-08-09: qty 10

## 2011-08-09 MED ORDER — MIDAZOLAM HCL 5 MG/5ML IJ SOLN
INTRAMUSCULAR | Status: DC | PRN
  Administered 2011-08-09 (×2): 1 mg via INTRAVENOUS
  Administered 2011-08-09: 2 mg via INTRAVENOUS

## 2011-08-09 NOTE — H&P (Signed)
Tana Coast, PA  07/19/2011 11:26 AM  Signed Primary Care Physician:  Lilyan Punt, MD, MD   Primary Gastroenterologist:  Roetta Sessions, MD      Chief Complaint   Patient presents with   .  Colonoscopy      HPI:  Jim [name] is a 65 y.o. male here to schedule surveillance colonoscopy. He has a history of adenomatous polyps on colonoscopy back in 2000. Last colonoscopy in 2007, no polyps. Denies constipation, diarrhea, melena, rectal bleeding, abdominal pain, vomiting, heartburn, dysphagia.    Current Outpatient Prescriptions   Medication  Sig  Dispense  Refill   .  aspirin 81 MG tablet  Take 81 mg by mouth daily.           Marland Kitchen  atorvastatin (LIPITOR) 80 MG tablet  Take 80 mg by mouth daily.           .  clopidogrel (PLAVIX) 75 MG tablet  Take 75 mg by mouth daily.           .  folic acid (FOLVITE) 800 MCG tablet  Take 400 mcg by mouth daily.           .  metoprolol tartrate (LOPRESSOR) 25 MG tablet  Take 25 mg by mouth daily. 1 1/2 tablets daily          .  niacin (NIASPAN) 1000 MG CR tablet  Take 1,000 mg by mouth at bedtime.           .  ramipril (ALTACE) 2.5 MG tablet  Take 2.5 mg by mouth daily.               Allergies as of 07/19/2011   .  (No Known Allergies)       Past Medical History   Diagnosis  Date   .  Prostate cancer  2012       XRT, seed implants   .  Carotid artery disease     .  PVD (peripheral vascular disease)             .  CAD (coronary artery disease)              .  AAA (abdominal aortic aneurysm)         Infrarenal abdominal aortic aneurysm of 48 mm, ct 5/12, followed by Dr. Nanetta Batty.       Past Surgical History   Procedure  Date   .  Inguinal hernia repair         bilateral   .  Hernia repair         periumbilical   .  Coronary artery bypass graft  2001   .  Gallbladder surgery     .  Bilateral lower extremity stents         right common femoral and left external iliac   .  Carotid endarterectomy         bilateral, right in  2009 and left in 2001   .  Colonoscopy  3/07       diverticulosis, left-sided and internal hemorrhoids       Family History   Problem  Relation  Age of Onset   .  Prostate cancer  Father     .  Cancer  Mother         ?       History       Social History   .  Marital Status:  Single  Spouse Name:  N/A       Number of Children:  0   .  Years of Education:  N/A       Occupational History   .    [employer]       Social History Main Topics   .  Smoking status:  Former Smoker -- 1.0 packs/day       Types:  Cigarettes   .  Smokeless tobacco:  Not on file     Comment: quit 10 years ago   .  Alcohol Use:  No   .  Drug Use:  No   .  Sexually Active:  Not on file       Other Topics  Concern   .  Not on file       Social History Narrative   .  No narrative on file        ROS:   General: Negative for anorexia, weight loss, fever, chills, fatigue, weakness. Eyes: Negative for vision changes.   ENT: Negative for hoarseness, difficulty swallowing , nasal congestion. CV: Negative for chest pain, angina, palpitations, dyspnea on exertion, peripheral edema.   Respiratory: Negative for dyspnea at rest, dyspnea on exertion, cough, sputum, wheezing.   GI: See history of present illness. GU:  Negative for dysuria, hematuria, urinary incontinence, urinary frequency, nocturnal urination.   MS: Negative for joint pain, low back pain.   Derm: Negative for rash or itching.   Neuro: Negative for weakness, abnormal sensation, seizure, frequent headaches, memory loss, confusion.   Psych: Negative for anxiety, depression, suicidal ideation, hallucinations.   Endo: Negative for unusual weight change.   Heme: Negative for bruising or bleeding. Allergy: Negative for rash or hives.     Physical Examination:   BP 114/71  Pulse 92  Temp(Src) 97.2 F (36.2 C) (Temporal)  Ht 6\' 1"  (1.854 m)  Wt 163 lb 12.8 oz (74.299 kg)  BMI 21.61 kg/m2    General: Well-nourished,  well-developed in no acute distress.   Head: Normocephalic, atraumatic.    Eyes: Conjunctiva pink, no icterus. Mouth: Oropharyngeal mucosa moist and pink , no lesions erythema or exudate. Neck: Supple without thyromegaly, masses, or lymphadenopathy.   Lungs: Clear to auscultation bilaterally.   Heart: Regular rate and rhythm, no murmurs rubs or gallops.   Abdomen: Bowel sounds are normal, nontender, nondistended, no hepatosplenomegaly or masses, no abdominal bruits or hernia , no rebound or guarding.    Rectal: Deferred to time of colonoscopy. Extremities: No lower extremity edema. No clubbing or deformities.   Neuro: Alert and oriented x 4 , grossly normal neurologically.   Skin: Warm and dry, no rash or jaundice.    Psych: Alert and cooperative, normal mood and affect.    Glendora Score  07/19/2011 11:59 AM  Signed Cc to PCP        Hx of adenomatous colonic polyps - Tana Coast, PA  07/19/2011 11:26 AM  Signed Due for surveillance colonoscopy. I have discussed the risks, alternatives, benefits with regards to but not limited to the risk of reaction to medication, bleeding, infection, perforation and the patient is agreeable to proceed. Written consent to be obtained.      I have seen the patient prior to the procedure(s) today and reviewed the history and physical / consultation from 07/19/11.  There have been no changes. After consideration of the risks, benefits, alternatives and imponderables, the patient has consented to the procedure(s).

## 2011-08-18 ENCOUNTER — Encounter (HOSPITAL_COMMUNITY): Payer: Self-pay | Admitting: Internal Medicine

## 2011-09-02 LAB — COMPREHENSIVE METABOLIC PANEL
ALT: 25
CO2: 32
Calcium: 9.2
Creatinine, Ser: 0.91
GFR calc non Af Amer: >60
Glucose, Bld: 86

## 2011-09-02 LAB — BASIC METABOLIC PANEL
BUN: 11
Chloride: 106
Potassium: 3.9
Sodium: 139

## 2011-09-02 LAB — CBC
HCT: 33.4 — ABNORMAL LOW
HCT: 41.5
Hemoglobin: 11.3 — ABNORMAL LOW
Hemoglobin: 13.8
MCHC: 33.2
MCV: 89
MCV: 90.6
Platelets: 166
RBC: 3.75 — ABNORMAL LOW
RBC: 4.58
WBC: 8.6

## 2011-09-02 LAB — URINALYSIS, ROUTINE W REFLEX MICROSCOPIC
Glucose, UA: NEGATIVE
Ketones, ur: 15 — AB
Protein, ur: NEGATIVE

## 2011-09-02 LAB — APTT: aPTT: 28

## 2011-09-02 LAB — PROTIME-INR: Prothrombin Time: 12.8

## 2011-09-02 LAB — ABO/RH: ABO/RH(D): A POS

## 2011-10-11 ENCOUNTER — Telehealth: Payer: Self-pay | Admitting: *Deleted

## 2011-10-11 ENCOUNTER — Other Ambulatory Visit: Payer: Self-pay | Admitting: Radiation Oncology

## 2011-10-11 DIAGNOSIS — C61 Malignant neoplasm of prostate: Secondary | ICD-10-CM | POA: Insufficient documentation

## 2011-10-11 MED ORDER — TAMSULOSIN HCL 0.4 MG PO CAPS: 0.4000 mg | ORAL_CAPSULE | Freq: Every day | ORAL | Status: DC

## 2011-10-11 NOTE — Telephone Encounter (Signed)
Called pt home phone, pt RX for flomax has been ordered by MD in Epic, left message on his voice answering machine to call if any questions 2:11 PM

## 2011-10-18 ENCOUNTER — Ambulatory Visit: Payer: 59 | Admitting: Radiation Oncology

## 2011-10-20 ENCOUNTER — Encounter: Payer: Self-pay | Admitting: Surgery

## 2011-10-28 ENCOUNTER — Encounter: Payer: Self-pay | Admitting: *Deleted

## 2011-10-28 DIAGNOSIS — Z923 Personal history of irradiation: Secondary | ICD-10-CM | POA: Insufficient documentation

## 2011-11-01 ENCOUNTER — Encounter: Payer: Self-pay | Admitting: Radiation Oncology

## 2011-11-01 ENCOUNTER — Ambulatory Visit
Admission: RE | Admit: 2011-11-01 | Discharge: 2011-11-01 | Disposition: A | Discharge status: Home / Self Care | Payer: 59 | Source: Ambulatory Visit | Attending: Radiation Oncology | Admitting: Radiation Oncology

## 2011-11-01 VITALS — BP 116/76 | HR 81 | Temp 97.1°F | Resp 20 | Wt 166.1 lb

## 2011-11-01 DIAGNOSIS — C61 Malignant neoplasm of prostate: Secondary | ICD-10-CM

## 2011-11-01 NOTE — Progress Notes (Addendum)
Pt doing well, still takes flomax which says it keeps it steady the flow, no c/o, not taking pepcid instead takes 1000mg  tums daily 10:09 AM

## 2011-11-01 NOTE — Progress Notes (Signed)
CC:   Mark C. Vernie Ammons, M.D. Scott A. Gerda Diss, MD  DIAGNOSIS:  Stage T1c prostate cancer.  INTERVAL SINCE RADIATION THERAPY:  Eight months.  NARRATIVE:  Mr. Jim [name] comes in today for routine followup.  He clinically seems to be doing well at this time.  The patient denies any problems with urination.  He has nocturia 0-1.  The patient denies any hematuria or bowel complaints.  He denies any rectal bleeding.  The patient did see Dr. Vernie Ammons  after completion of his therapy, and according to the patient, his PSA was less than 1.  I have requested a copy of these reports for our records.  PHYSICAL EXAMINATION:  Vital Signs:  The patient's temperature is 97.1; pulse is 81; blood pressure is 116/76.  Weight is 166 pounds.  Neck: Examination of the neck and supraclavicular region reveals no evidence of adenopathy.  Lungs:  The lungs are clear to auscultation.  Heart: The heart has regular rhythm and rate.  Abdomen:  The abdomen is soft and nontender with normal bowel sounds.  IMPRESSION AND PLAN:  The patient is doing well at this time without any appreciable side effects from his treatment.  In light of the patient's close followup with Dr. Vernie Ammons, I have not scheduled Mr. Heard for formal followup appointments but would be glad to see him at any time.    ______________________________ Billie Lade, Ph.D., M.D. JDK/MEDQ  D:  11/01/2011  T:  11/01/2011  Job:  [id number]

## 2011-11-08 ENCOUNTER — Ambulatory Visit (INDEPENDENT_AMBULATORY_CARE_PROVIDER_SITE_OTHER): Payer: 59 | Admitting: Vascular Surgery

## 2011-11-08 ENCOUNTER — Ambulatory Visit: Payer: 59 | Admitting: Surgery

## 2011-11-08 DIAGNOSIS — I714 Abdominal aortic aneurysm, without rupture: Secondary | ICD-10-CM

## 2011-11-08 NOTE — Progress Notes (Signed)
AAA duplex performed 11/08/2011

## 2011-12-13 NOTE — Procedures (Unsigned)
DUPLEX ULTRASOUND OF ABDOMINAL AORTA  INDICATION:  Abdominal aortic aneurysm.  HISTORY: Diabetes:  No. Cardiac:  CABG in 2001. Hypertension:  Yes. Smoking:  Previous. Connective Tissue Disorder: Family History:  Father with history of MI. Previous Surgery:  No aortic intervention.  DUPLEX EXAM:         AP (cm)                   TRANSVERSE (cm) Proximal             2.40 cm                   2.55 cm Mid                  2.46 cm                   2.46 cm Distal               4.68 cm                   4.68 cm Right Iliac          0.95 cm                   1.19 cm Left Iliac           1.10 cm                   1.25 cm  PREVIOUS:  Date: CT at outside facility, 04/05/2011  AP:  4.8 TRANSVERSE:  IMPRESSION: 1. Abdominal aortic aneurysm present involving the distal segment with     intramural thrombus present, measuring 4.68 cm X 4.68 cm. 2. Patent left common iliac artery stent at the proximal anastomosis     observed. 3. Remainder of abdominal aorta and proximal iliac arteries diameters     are within normal limits.  ___________________________________________ V. Charlena Cross, MD  SH/MEDQ  D:  11/08/2011  T:  11/08/2011  Job:  [id number]

## 2012-01-26 ENCOUNTER — Other Ambulatory Visit: Payer: Self-pay | Admitting: Radiation Oncology

## 2012-01-27 ENCOUNTER — Telehealth: Payer: Self-pay

## 2012-01-27 NOTE — Telephone Encounter (Signed)
Received electronic prescription refill request from [facility] for flomax 0.4 mg daily. Dr.Kinard no longer following this patient so requests Dr.Ottelin or Dr.Luking prescribe. Spoke with patient and he prefers rx go through Dr. Lilyan Punt. [facility] [address] Fax:[phone].

## 2012-10-02 ENCOUNTER — Encounter: Payer: Self-pay | Admitting: Vascular Surgery

## 2013-02-20 ENCOUNTER — Other Ambulatory Visit (HOSPITAL_COMMUNITY): Payer: Self-pay | Admitting: Cardiovascular Disease

## 2013-02-20 DIAGNOSIS — I714 Abdominal aortic aneurysm, without rupture: Secondary | ICD-10-CM

## 2013-02-20 DIAGNOSIS — I779 Disorder of arteries and arterioles, unspecified: Secondary | ICD-10-CM

## 2013-03-05 ENCOUNTER — Ambulatory Visit (HOSPITAL_COMMUNITY)
Admission: RE | Admit: 2013-03-05 | Discharge: 2013-03-05 | Disposition: A | Discharge status: Home / Self Care | Payer: 59 | Source: Ambulatory Visit | Attending: Cardiovascular Disease | Admitting: Cardiovascular Disease

## 2013-03-05 DIAGNOSIS — I714 Abdominal aortic aneurysm, without rupture, unspecified: Secondary | ICD-10-CM | POA: Insufficient documentation

## 2013-03-05 DIAGNOSIS — I779 Disorder of arteries and arterioles, unspecified: Secondary | ICD-10-CM | POA: Insufficient documentation

## 2013-03-05 NOTE — Progress Notes (Signed)
ABIs and lower extremity duplex doppler was completed. Jim [name]

## 2013-03-05 NOTE — Progress Notes (Signed)
Abdominal aortic exam completed. Larene Pickett RVT

## 2013-04-28 ENCOUNTER — Other Ambulatory Visit (HOSPITAL_COMMUNITY): Payer: Self-pay | Admitting: Cardiovascular Disease

## 2013-07-16 ENCOUNTER — Other Ambulatory Visit (HOSPITAL_COMMUNITY): Payer: Self-pay | Admitting: Cardiovascular Disease

## 2013-09-04 ENCOUNTER — Other Ambulatory Visit (HOSPITAL_COMMUNITY): Payer: Self-pay | Admitting: Cardiovascular Disease

## 2013-09-04 NOTE — Telephone Encounter (Signed)
Rx was sent to pharmacy electronically. 

## 2013-10-29 ENCOUNTER — Other Ambulatory Visit (HOSPITAL_COMMUNITY): Payer: Self-pay | Admitting: Cardiovascular Disease

## 2013-10-29 DIAGNOSIS — I739 Peripheral vascular disease, unspecified: Secondary | ICD-10-CM

## 2013-10-31 ENCOUNTER — Other Ambulatory Visit: Payer: Self-pay | Admitting: Family Medicine

## 2013-10-31 NOTE — Telephone Encounter (Signed)
Not seen since epic

## 2013-11-01 ENCOUNTER — Ambulatory Visit (HOSPITAL_COMMUNITY)
Admission: RE | Admit: 2013-11-01 | Discharge: 2013-11-01 | Disposition: A | Discharge status: Home / Self Care | Payer: 59 | Source: Ambulatory Visit | Attending: Internal Medicine | Admitting: Internal Medicine

## 2013-11-01 DIAGNOSIS — I70219 Atherosclerosis of native arteries of extremities with intermittent claudication, unspecified extremity: Secondary | ICD-10-CM

## 2013-11-01 DIAGNOSIS — I739 Peripheral vascular disease, unspecified: Secondary | ICD-10-CM

## 2013-11-01 NOTE — Progress Notes (Signed)
Arterial Duplex Lower Ext. Completed. Seaborn Nakama, BS, RDMS, RVT  

## 2013-11-30 ENCOUNTER — Ambulatory Visit (INDEPENDENT_AMBULATORY_CARE_PROVIDER_SITE_OTHER): Payer: 59 | Admitting: Cardiovascular Disease

## 2013-11-30 ENCOUNTER — Encounter: Payer: Self-pay | Admitting: Cardiovascular Disease

## 2013-11-30 VITALS — BP 110/60 | HR 82 | Ht 73.0 in | Wt 157.0 lb

## 2013-11-30 DIAGNOSIS — I714 Abdominal aortic aneurysm, without rupture, unspecified: Secondary | ICD-10-CM

## 2013-11-30 DIAGNOSIS — I2510 Atherosclerotic heart disease of native coronary artery without angina pectoris: Secondary | ICD-10-CM

## 2013-11-30 DIAGNOSIS — Z79899 Other long term (current) drug therapy: Secondary | ICD-10-CM

## 2013-11-30 DIAGNOSIS — E785 Hyperlipidemia, unspecified: Secondary | ICD-10-CM

## 2013-11-30 NOTE — Progress Notes (Signed)
Patient ID: LAMARIUS DIRR, male   DOB: 1945-11-26, 68 y.o.   MRN: [id number]  Chief Complaint  Patient presents with  . Follow-up    lower extremity doppler; DOE    HPI AMAL SAIKI is a 68 y.o. male with h/o PVD, CAD (s/p coronary artery bypass grafting in 2001 with LIMA to his LAD, vein to obtuse marginal branch and to the RCA), AAA and prostate cancer who presents for yearly follow up.   He reports feeling well. He continues to have right calf discomfort when he walks up to 1/4 mile. Pain subsides with rest. This is unchanged from previous. He works in [employer] [place] and walks a lot for work. He otherwise doesn't exercise on a regular basis.  He denies any chest pain, any shortness of breath, any lower extremity swelling or palpitations. He denies any abdominal pain.  He quit smoking in 2001 and had a 30 pack year history prior to that.   He had a lower extremity arterial duplex done on 11/01/13 which showed increase in diameter of AAA from 4.9x4.7cm on 03/05/13 to 5.1x5.0cm. He was also found to have a new aneurysmal dilatation of the left CIA. It also showed worsening occlusion in the SFA.  He had both iliac arteries stented in December 2001. He also underwent bilateral carotid endarterectomy in 2008. Last functional study was on December 6th 2011 and showed diaphragmatic attenuation with mild inferolateral ischemia.    Past Medical History  Diagnosis Date  . Prostate cancer 2012    XRT, seed implants  . Carotid artery disease   . PVD (peripheral vascular disease)        . CAD (coronary artery disease)         . AAA (abdominal aortic aneurysm)     Infrarenal abdominal aortic aneurysm of 48 mm, ct 5/12, followed by Dr. Quay Burow.  . Hyperlipidemia   . Hypertension   . Myocardial infarction   . Neuromuscular disorder 2002  . Hx of radiation therapy 01/13/11-03/09/11    prostate  . Arthritis     Past Surgical History  Procedure Laterality Date   . Inguinal hernia repair      bilateral  . Hernia repair      periumbilical  . Coronary artery bypass graft  2001  . Bilateral lower extremity stents      right common femoral and left external iliac  . Carotid endarterectomy      bilateral, right in 2009 and left in 2001  . Colonoscopy  08/09/2011    Procedure: COLONOSCOPY;  Surgeon: Daneil Dolin, MD;  Location: [facility];  Service: Endoscopy;  Laterality: N/A;  9:15  . Cholecystectomy      Family History  Problem Relation Age of Onset  . Heart attack Father   . Prostate cancer Father     mets to bone  . Cancer Mother     ?    Social History History  Substance Use Topics  . Smoking status: Former Smoker -- 1.00 packs/day for 15 years    Types: Cigarettes    Quit date: 11/22/2000  . Smokeless tobacco: Not on file     Comment: quit 10 years ago  . Alcohol Use: Yes     Comment: occasional    No Known Allergies  Current Outpatient Prescriptions  Medication Sig Dispense Refill  . ammonium lactate (AMLACTIN) 12 % cream APPLY TO AFFECTED AREA 2-3 TIMES DAILY  385 g  0  .  aspirin 81 MG tablet Take 81 mg by mouth daily.        Marland Kitchen atorvastatin (LIPITOR) 80 MG tablet Take 1 tablet (80 mg total) by mouth daily.  90 tablet  1  . calcium carbonate (TUMS - DOSED IN MG ELEMENTAL CALCIUM) 500 MG chewable tablet Chew 1,000 mg by mouth daily.        . clopidogrel (PLAVIX) 75 MG tablet TAKE 1 TABLET EVERY DAY  90 tablet  3  . folic acid (FOLVITE) 081 MCG tablet Take 400 mcg by mouth daily.        . metoprolol tartrate (LOPRESSOR) 25 MG tablet TAKE 1/2 TABLET EVERY MORNING AND 1 TABLET AT BEDTIME  135 tablet  1  . Multiple Vitamin (MULTIVITAMIN) capsule Take 1 capsule by mouth daily.        . niacin (NIASPAN) 1000 MG CR tablet TAKE 1 TABLET AT BEDTIME  90 tablet  1  . ramipril (ALTACE) 2.5 MG capsule TAKE ONE CAPSULE EVERY DAY  90 capsule  3  . ramipril (ALTACE) 2.5 MG tablet Take 2.5 mg by mouth daily.        . Tamsulosin HCl  (FLOMAX) 0.4 MG CAPS Take 1 capsule (0.4 mg total) by mouth at bedtime.  30 capsule  2   No current facility-administered medications for this visit.    Review of Systems Review of Systems Negative except per HPI Blood pressure 110/60, pulse 82, height 6\' 1"  (1.854 m), weight 157 lb (71.215 kg).  Physical Exam Physical Exam General: pleasant white male, in no acute distress, alert and oriented x4 HEENT: PERRLA, EOMI, no carotid bruit appreciated, no JVD CV: S1S2, RRR, no murmur appreciated Bruit at the left femoral artery, bounding pulse in left periumbilical region Pulm: clear to auscultation bilaterally, normal work of breathing Extremities: no edema, lower extremities are warm  Data Reviewed EKG: normal sinus rhythm with ventricular rate of 78bpm Unchanged from previous EKG  02/19/13: LDL: 59, HDL:42, total: 116, trig: 73  Assessment/Plan:  68 y.o. male with h/o PVD, CAD (s/p coronary artery bypass grafting in 2001 with LIMA to his LAD, vein to obtuse marginal branch and to the RCA), AAA and prostate cancer who presents for yearly follow up.  1. AAA: increasing in size since prior study, now 5.1x5.0cm.  - refer to cardiovascular surgery for evaluation 2. H/o CAD: asymptomatic at this time.  - Since his last functional study was 4 years ago, will obtain [employer].  - continue current medical therapy which includes: lipitor 80, ramipril 2.5mg  , metoprolol 25mg  3. PVD: symptoms stable despite evidence of worsening disease from recent lower extremity duplex.  - continue ASA, Plavix - repeat duplex in 6 months from prior study  4. Hypertension: well controled  - continue ramipril, metoprolol 5. Hyperlipidemia: cholesterol at goal with LDL: 59 in march 2014 - Repeat lipid panel and LFT's.  - continue lipitor 80mg  daily      Jasan Doughtie 11/30/2013, 8:57 AM

## 2013-11-30 NOTE — Patient Instructions (Signed)
  Your physician wants you to follow-up with him in : 1 year with Dr Gwenlyn Found                                            and with an extender in : 6 months                     You will receive a reminder letter in the mail one month in advance. If you don't receive a letter, please call our office to schedule the follow-up appointment.   Your physician recommends that you return for lab work at your convenience, fasting   Your physician has ordered the following tests: lexiscan myoview  Dr Gwenlyn Found has referred you to Dr Trula Slade, a vascular surgeon, to discuss you aortic aneurysm

## 2013-12-04 LAB — LIPID PANEL
CHOL/HDL RATIO: 2.7 ratio
CHOLESTEROL: 131 mg/dL (ref 0–200)
HDL: 49 mg/dL (ref >39)
LDL Cholesterol: 70 mg/dL (ref 0–99)
TRIGLYCERIDES: 59 mg/dL (ref <150)
VLDL: 12 mg/dL (ref 0–40)

## 2013-12-04 LAB — HEPATIC FUNCTION PANEL
ALT: 25 U/L (ref 0–53)
AST: 20 U/L (ref 0–37)
Albumin: 3.8 g/dL (ref 3.5–5.2)
Alkaline Phosphatase: 111 U/L (ref 39–117)
BILIRUBIN DIRECT: 0.1 mg/dL (ref 0.0–0.3)
BILIRUBIN INDIRECT: 0.4 mg/dL (ref 0.0–0.9)
BILIRUBIN TOTAL: 0.5 mg/dL (ref 0.3–1.2)
Total Protein: 5.8 g/dL — ABNORMAL LOW (ref 6.0–8.3)

## 2013-12-05 ENCOUNTER — Ambulatory Visit (HOSPITAL_COMMUNITY)
Admission: RE | Admit: 2013-12-05 | Discharge: 2013-12-05 | Disposition: A | Discharge status: Home / Self Care | Payer: 59 | Source: Ambulatory Visit | Attending: Cardiovascular Disease | Admitting: Cardiovascular Disease

## 2013-12-05 DIAGNOSIS — I2510 Atherosclerotic heart disease of native coronary artery without angina pectoris: Secondary | ICD-10-CM | POA: Insufficient documentation

## 2013-12-05 MED ORDER — TECHNETIUM TC 99M SESTAMIBI GENERIC - CARDIOLITE
30.0000 | Freq: Once | INTRAVENOUS | Status: AC | PRN
  Administered 2013-12-05: 30 via INTRAVENOUS

## 2013-12-05 MED ORDER — TECHNETIUM TC 99M SESTAMIBI GENERIC - CARDIOLITE
10.0000 | Freq: Once | INTRAVENOUS | Status: AC | PRN
  Administered 2013-12-05: 10 via INTRAVENOUS

## 2013-12-05 MED ORDER — AMINOPHYLLINE 25 MG/ML IV SOLN
75.0000 mg | Freq: Once | INTRAVENOUS | Status: AC
  Administered 2013-12-05: 75 mg via INTRAVENOUS

## 2013-12-05 MED ORDER — REGADENOSON 0.4 MG/5ML IV SOLN
0.4000 mg | Freq: Once | INTRAVENOUS | Status: AC
  Administered 2013-12-05: 0.4 mg via INTRAVENOUS

## 2013-12-05 NOTE — Procedures (Addendum)
[place] [address] [place] [address] [phone]  Cardiology Nuclear Med Study  Jim [name] is a 68 y.o. male     MRN : [id number]     DOB: 1946-05-26  Procedure Date: 12/05/2013  Nuclear Med Background Indication for Stress Test:  Graft Patency and Stent Patency History:  CAD;MI;CABG X3-2001;STENT-2001;Bilateral carotid endartectomy;prostate CA Cardiac Risk Factors: Carotid Disease, Family History - CAD, History of Smoking, Hypertension, Lipids, PVD and AAA  Symptoms:  DOE   Nuclear Pre-Procedure Caffeine/Decaff Intake:  7:00pm NPO After: 5:00am   IV Site: R Forearm  IV 0.9% NS with Angio Cath:  22g  Chest Size (in):  38"  IV Started by: Azucena Cecil, RN  Height: 6\' 1"  (1.854 m)  Cup Size: n/a  BMI:  Body mass index is 20.72 kg/(m^2). Weight:  157 lb (71.215 kg)   Tech Comments:  n/a    Nuclear Med Study 1 or 2 day study: 1 day  Stress Test Type:  [place] Provider:  Quay Burow, MD   Resting Radionuclide: Technetium 88m Sestamibi  Resting Radionuclide Dose: 10.1 mCi   Stress Radionuclide:  Technetium 45m Sestamibi  Stress Radionuclide Dose: 30.3 mCi           Stress Protocol Rest HR: 79 Stress HR: 90  Rest BP: 130/70 Stress BP: 130/64  Exercise Time (min): n/a METS: n/a   Predicted Max HR: 152 bpm % Max HR: 64.47 bpm Rate Pressure Product: 12740  Dose of Adenosine (mg):  n/a Dose of Lexiscan: 0.4 mg  Dose of Atropine (mg): n/a Dose of Dobutamine: n/a mcg/kg/min (at max HR)  Stress Test Technologist: Leane Para, CCT Nuclear Technologist: Otho Perl, CNMT   Rest Procedure:  Myocardial perfusion imaging was performed at rest 45 minutes following the intravenous administration of Technetium 67m Sestamibi. Stress Procedure:  The patient received IV Lexiscan 0.4 mg over 15-seconds.  Technetium 71m Sestamibi injected at 30-seconds.  The patient experienced SOB,  Nausea and dizziness; 75 mg of IV Aminophylline was given with resolution of symptoms.  There were no significant changes with Lexiscan.  Quantitative spect images were obtained after a 45 minute delay.  Transient Ischemic Dilatation (Normal <1.22):  0.92 Lung/Heart Ratio (Normal <0.45):  0.33 QGS EDV:  121 ml QGS ESV:  84 ml LV Ejection Fraction: 30%  Rest ECG: NSR - Normal EKG  Stress ECG: No significant change from baseline ECG  QPS Raw Data Images:  Normal; no motion artifact; normal heart/lung ratio. Stress Images:  large defect in the inferolateral walls Rest Images:  large inferolateral defect Subtraction (SDS):  SDS 4. Partially reversible defect of the inferior and lateral walls  Impression Exercise Capacity:  Lexiscan with no exercise. BP Response:  Hypotensive blood pressure response. Clinical Symptoms:  Dyspnea, nausea ECG Impression:  There are scattered PVCs. Comparison with Prior Nuclear Study: No images to compare  Overall Impression:  Intermediate risk stress nuclear study with a large inferior and lateral defect which is partially reversible - this could be hybernating myocardium or scar with peri-infarct ischemia.  LV Wall Motion:  Inferolateral akinesis - EF 30%.  [name], MD, [facility] Board Certified in Nuclear Cardiology Attending Cardiologist [place], MD  12/05/2013 1:42 PM

## 2013-12-14 ENCOUNTER — Encounter: Payer: Self-pay | Admitting: *Deleted

## 2013-12-18 ENCOUNTER — Ambulatory Visit (INDEPENDENT_AMBULATORY_CARE_PROVIDER_SITE_OTHER): Payer: 59 | Admitting: Cardiovascular Disease

## 2013-12-18 ENCOUNTER — Telehealth: Payer: Self-pay | Admitting: Cardiovascular Disease

## 2013-12-18 ENCOUNTER — Encounter: Payer: Self-pay | Admitting: Cardiovascular Disease

## 2013-12-18 VITALS — BP 120/60 | HR 88 | Ht 73.0 in | Wt 158.0 lb

## 2013-12-18 DIAGNOSIS — I2510 Atherosclerotic heart disease of native coronary artery without angina pectoris: Secondary | ICD-10-CM | POA: Insufficient documentation

## 2013-12-18 DIAGNOSIS — F17200 Nicotine dependence, unspecified, uncomplicated: Secondary | ICD-10-CM

## 2013-12-18 DIAGNOSIS — I714 Abdominal aortic aneurysm, without rupture, unspecified: Secondary | ICD-10-CM

## 2013-12-18 DIAGNOSIS — E785 Hyperlipidemia, unspecified: Secondary | ICD-10-CM | POA: Insufficient documentation

## 2013-12-18 DIAGNOSIS — I743 Embolism and thrombosis of arteries of the lower extremities: Secondary | ICD-10-CM

## 2013-12-18 DIAGNOSIS — I779 Disorder of arteries and arterioles, unspecified: Secondary | ICD-10-CM | POA: Insufficient documentation

## 2013-12-18 DIAGNOSIS — Z720 Tobacco use: Secondary | ICD-10-CM | POA: Insufficient documentation

## 2013-12-18 DIAGNOSIS — I739 Peripheral vascular disease, unspecified: Secondary | ICD-10-CM

## 2013-12-18 NOTE — Assessment & Plan Note (Signed)
Status post bilateral carotid endarterectomies which were followed by duplex ultrasound in the office. This was last checked 2 years ago. I will recheck carotid Doppler studies.

## 2013-12-18 NOTE — Assessment & Plan Note (Signed)
Status post coronary artery bypass grafting in 2001 with a LIMA to his LAD, vein to the obtuse marginal branch and to the RCA. His most recent Myoview stress test showed inferolateral scar with moderate superimposed ischemia and an ejection fraction of 30% by quantitative pain suspect. This was similar to his last stress test performed in 2011. He is clinically asymptomatic. His EF by 2-D echo is mildly greater then this. Should you require a vascular surgical procedure he would be at mild to moderately increased surgical risk based on his Myoview stress test result and his moderate LV dysfunction.Marland Kitchen Marland Kitchen

## 2013-12-18 NOTE — Telephone Encounter (Signed)
Left message for patient to call and schedule carotid doppler that was ordered by Dr. Gwenlyn Found.

## 2013-12-18 NOTE — Patient Instructions (Addendum)
Your physician wants you to follow-up in: 6 months with Dr Gwenlyn Found. You will receive a reminder letter in the mail two months in advance. If you don't receive a letter, please call our office to schedule the follow-up appointment.  Dr Gwenlyn Found has ordered carotid dopplers.

## 2013-12-18 NOTE — Assessment & Plan Note (Signed)
We have been following this serially by duplex ultrasound was recently measuring 5.1 centimeters which represents mild increase in diameter.based on his I'm referring him to Dr. Rock Nephew grab him for vascular surgical evaluation. He does have a history of bilateral iliac stenting back in 2001 by myself, right common and left external iliac. 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 His right SFA is occluded which he was back in 2001. He does have Rutherford class 1-2 claudication

## 2013-12-18 NOTE — Assessment & Plan Note (Signed)
On statin therapy with his most recent lipid profile performed 12/04/13 revealing a total cholesterol of 31, LDL 70 HDL of 49

## 2013-12-18 NOTE — Progress Notes (Signed)
12/18/2013 THORNE WIRZ   [birth year]  [id number]  Primary Physician Sallee Lange, MD Primary Cardiologist: Lorretta Harp MD Renae Gloss   HPI:  Jim [name] is a 68 y.o. male with h/o PVD, CAD (s/p coronary artery bypass grafting in 2001 with LIMA to his LAD, vein to obtuse marginal branch and to the RCA), AAA and prostate cancer who presents for yearly follow up.  He reports feeling well. He continues to have right calf discomfort when he walks up to 1/4 mile. Pain subsides with rest. This is unchanged from previous. He works in [employer] [place] and walks a lot for work. He otherwise doesn't exercise on a regular basis.  He denies any chest pain, any shortness of breath, any lower extremity swelling or palpitations. He denies any abdominal pain.  He quit smoking in 2001 and had a 30 pack year history prior to that.  He had a lower extremity arterial duplex done on 11/01/13 which showed increase in diameter of AAA from 4.9x4.7cm on 03/05/13 to 5.1x5.0cm. He was also found to have a new aneurysmal dilatation of the left CIA. It also showed worsening occlusion in the SFA.  He had both iliac arteries stented in December 2001. He also underwent bilateral carotid endarterectomy in 2008. Last functional study was on December 6th 2011 and showed diaphragmatic attenuation with mild inferolateral ischemia.  He had a Myoview stress test performed recently that showed inferolateral scar with moderate superimposed ischemia and an ejection fraction of 30% similar to his prior functional study performed in 2011. He has an appointment with Dr. Rock Nephew.Brabham next month for evaluation of his abdominal aortic aneurysm.    Current Outpatient Prescriptions  Medication Sig Dispense Refill  . ammonium lactate (AMLACTIN) 12 % cream APPLY TO AFFECTED AREA 2-3 TIMES DAILY  385 g  0  . aspirin 81 MG tablet Take 81 mg by mouth daily.        Marland Kitchen atorvastatin (LIPITOR) 80 MG  tablet Take 1 tablet (80 mg total) by mouth daily.  90 tablet  1  . calcium carbonate (TUMS - DOSED IN MG ELEMENTAL CALCIUM) 500 MG chewable tablet Chew 1,000 mg by mouth daily.        . clopidogrel (PLAVIX) 75 MG tablet TAKE 1 TABLET EVERY DAY  90 tablet  3  . folic acid (FOLVITE) 413 MCG tablet Take 400 mcg by mouth daily.        . metoprolol tartrate (LOPRESSOR) 25 MG tablet TAKE 1/2 TABLET EVERY MORNING AND 1 TABLET AT BEDTIME  135 tablet  1  . Multiple Vitamin (MULTIVITAMIN) capsule Take 1 capsule by mouth daily.        . niacin (NIASPAN) 1000 MG CR tablet TAKE 1 TABLET AT BEDTIME  90 tablet  1  . ramipril (ALTACE) 2.5 MG capsule TAKE ONE CAPSULE EVERY DAY  90 capsule  3  . ramipril (ALTACE) 2.5 MG tablet Take 2.5 mg by mouth daily.        . Tamsulosin HCl (FLOMAX) 0.4 MG CAPS Take 1 capsule (0.4 mg total) by mouth at bedtime.  30 capsule  2   No current facility-administered medications for this visit.    No Known Allergies  History   Social History  . Marital Status: Single    Spouse Name: N/A    Number of Children: 0  . Years of Education: N/A   Occupational History  .  [employer]   Social History Main Topics  .  Smoking status: Former Smoker -- 1.00 packs/day for 15 years    Types: Cigarettes    Quit date: 11/22/2000  . Smokeless tobacco: Not on file     Comment: quit 10 years ago  . Alcohol Use: Yes     Comment: occasional  . Drug Use: No  . Sexual Activity: Not on file   Other Topics Concern  . Not on file   Social History Narrative  . No narrative on file     Review of Systems: General: negative for chills, fever, night sweats or weight changes.  Cardiovascular: negative for chest pain, dyspnea on exertion, edema, orthopnea, palpitations, paroxysmal nocturnal dyspnea or shortness of breath Dermatological: negative for rash Respiratory: negative for cough or wheezing Urologic: negative for hematuria Abdominal: negative for nausea, vomiting,  diarrhea, bright red blood per rectum, melena, or hematemesis Neurologic: negative for visual changes, syncope, or dizziness All other systems reviewed and are otherwise negative except as noted above.    Blood pressure 120/60, pulse 88, height 6\' 1"  (1.854 m), weight 158 lb (71.668 kg).  General appearance: alert and no distress Neck: no adenopathy, no carotid bruit, no JVD, supple, symmetrical, trachea midline and thyroid not enlarged, symmetric, no tenderness/mass/nodules Lungs: clear to auscultation bilaterally Heart: regular rate and rhythm, S1, S2 normal, no murmur, click, rub or gallop Extremities: extremities normal, atraumatic, no cyanosis or edema  EKG not performed today  ASSESSMENT AND PLAN:   Hyperlipidemia On statin therapy with his most recent lipid profile performed 12/04/13 revealing a total cholesterol of 31, LDL 70 HDL of 49  Abdominal aortic aneurysm We have been following this serially by duplex ultrasound was recently measuring 5.1 centimeters which represents mild increase in diameter.based on his I'm referring him to Dr. Rock Nephew grab him for vascular surgical evaluation. He does have a history of bilateral iliac stenting back in 2001 by myself, right common and left external iliac. 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 His right SFA is occluded which he was back in 2001. He does have Rutherford class 1-2 claudication  Carotid artery disease Status post bilateral carotid endarterectomies which were followed by duplex ultrasound in the office. This was last checked 2 years ago. I will recheck carotid Doppler studies.  Coronary artery disease Status post coronary artery bypass grafting in 2001 with a LIMA to his LAD, vein to the obtuse marginal branch and to the RCA. His most recent Myoview stress test showed inferolateral scar with moderate superimposed ischemia and an ejection fraction of 30% by quantitative pain  suspect. This was similar to his last stress test performed in 2011. He is clinically asymptomatic. His EF by 2-D echo is mildly greater then this. Should you require a vascular surgical procedure he would be at mild to moderately increased surgical risk based on his Myoview stress test result and his moderate LV dysfunction.. .      Lorretta Harp MD FACP,FACC,FAHA, [facility] 12/18/2013 8:38 AM

## 2013-12-20 ENCOUNTER — Ambulatory Visit (HOSPITAL_COMMUNITY)
Admission: RE | Admit: 2013-12-20 | Discharge: 2013-12-20 | Disposition: A | Discharge status: Home / Self Care | Payer: 59 | Source: Ambulatory Visit | Attending: Cardiovascular Disease | Admitting: Cardiovascular Disease

## 2013-12-20 DIAGNOSIS — I739 Peripheral vascular disease, unspecified: Secondary | ICD-10-CM

## 2013-12-20 DIAGNOSIS — I779 Disorder of arteries and arterioles, unspecified: Secondary | ICD-10-CM | POA: Insufficient documentation

## 2013-12-20 DIAGNOSIS — I6529 Occlusion and stenosis of unspecified carotid artery: Secondary | ICD-10-CM

## 2013-12-20 NOTE — Progress Notes (Signed)
Carotid Duplex Completed. Echo Propp, BS, RDMS, RVT  

## 2013-12-27 ENCOUNTER — Other Ambulatory Visit: Payer: Self-pay | Admitting: *Deleted

## 2013-12-27 DIAGNOSIS — I714 Abdominal aortic aneurysm, without rupture, unspecified: Secondary | ICD-10-CM

## 2013-12-31 ENCOUNTER — Encounter: Payer: Self-pay | Admitting: *Deleted

## 2014-01-11 ENCOUNTER — Encounter: Payer: Self-pay | Admitting: Surgery

## 2014-01-14 ENCOUNTER — Ambulatory Visit (HOSPITAL_COMMUNITY)
Admission: RE | Admit: 2014-01-14 | Discharge: 2014-01-14 | Disposition: A | Discharge status: Home / Self Care | Payer: 59 | Source: Ambulatory Visit | Attending: Surgery | Admitting: Surgery

## 2014-01-14 ENCOUNTER — Telehealth: Payer: Self-pay | Admitting: Surgery

## 2014-01-14 ENCOUNTER — Encounter: Payer: Self-pay | Admitting: Surgery

## 2014-01-14 ENCOUNTER — Ambulatory Visit (INDEPENDENT_AMBULATORY_CARE_PROVIDER_SITE_OTHER): Payer: 59 | Admitting: Surgery

## 2014-01-14 VITALS — BP 100/67 | HR 80 | Ht 73.0 in | Wt 156.2 lb

## 2014-01-14 DIAGNOSIS — I714 Abdominal aortic aneurysm, without rupture, unspecified: Secondary | ICD-10-CM | POA: Insufficient documentation

## 2014-01-14 DIAGNOSIS — Z48812 Encounter for surgical aftercare following surgery on the circulatory system: Secondary | ICD-10-CM

## 2014-01-14 NOTE — Progress Notes (Signed)
Patient name: Jim [name] MRN: [id number] DOB: 12-15-1945 Sex: male     Chief Complaint  Patient presents with  . AAA    known AAA last OV 05/10/2011    HISTORY OF PRESENT ILLNESS: The patient comes back today for followup.  He is known to me, having undergone bilateral carotid endarterectomy.  I performed the right side for asymptomatic stenosis.  He has been followed with serial ultrasounds without recurrence.  He has a known abdominal aortic aneurysm.  The last time I saw him was in 2012.  At that time his aneurysm measured 4.8 cm by CT scan.  He has not followed up with me since that time even though he was scheduled for a 6 month followup.  He has a history of iliac stenting.  In the past I was concerned about being able to get a endovascular device through the stent.  The patient denies any abdominal pain or back pain.  He has no complaints today.  The patient has a history of coronary artery disease, having undergone coronary artery bypass grafting.  His hypercholesterolemia is managed with a statin.  His blood pressure is managed with an ACE inhibitor.  Past Medical History  Diagnosis Date  . Prostate cancer 2012    XRT, seed implants  . Carotid artery disease   . PVD (peripheral vascular disease)     Known iliac and SFA disease as well as a moderately sized abdominal aortic aneurism.  Marland Kitchen CAD (coronary artery disease)         . AAA (abdominal aortic aneurysm)     Infrarenal abdominal aortic aneurysm of 48 mm, ct 5/12, followed by Dr. Nanetta Batty.  . Hyperlipidemia   . Hypertension   . Myocardial infarction   . Neuromuscular disorder 2002  . Hx of radiation therapy 01/13/11-03/09/11    prostate  . Arthritis   . Hx of echocardiogram 01/26/2010    showed EF of 45% to 50% with basal and mid inferior/inferoseptal hypokinesia.  Marland Kitchen History of stress test 10/27/10    showed inferior posterior scar without ischemia, EF was 31% according to the myoview  . Tobacco abuse       Past Surgical History  Procedure Laterality Date  . Inguinal hernia repair      bilateral  . Hernia repair      periumbilical  . Coronary artery bypass graft  2001    with a LIMA to his LAD, vein to an obtuse marginal branch and to the RCA  . Bilateral lower extremity stents      right common femoral and left external iliac  . Carotid endarterectomy Bilateral     bilateral, right in 2009 and left in 2001  . Colonoscopy  08/09/2011    Procedure: COLONOSCOPY;  Surgeon: Corbin Ade, MD;  Location: [facility];  Service: Endoscopy;  Laterality: N/A;  9:15  . Cholecystectomy      History   Social History  . Marital Status: Single    Spouse Name: N/A    Number of Children: 0  . Years of Education: N/A   Occupational History  .  [employer]   Social History Main Topics  . Smoking status: Former Smoker -- 1.00 packs/day for 15 years    Types: Cigarettes    Quit date: 11/23/2003  . Smokeless tobacco: Not on file     Comment: quit 10 years ago  . Alcohol Use: Yes     Comment: occasional  .  Drug Use: No  . Sexual Activity: Not on file   Other Topics Concern  . Not on file   Social History Narrative  . No narrative on file    Family History  Problem Relation Age of Onset  . Heart attack Father   . Prostate cancer Father     mets to bone  . Cancer Father   . Heart disease Father   . Cancer Mother     ?    Allergies as of 01/14/2014  . (No Known Allergies)    Current Outpatient Prescriptions on File Prior to Visit  Medication Sig Dispense Refill  . aspirin 81 MG tablet Take 81 mg by mouth daily.        Marland Kitchen atorvastatin (LIPITOR) 80 MG tablet Take 1 tablet (80 mg total) by mouth daily.  90 tablet  1  . clopidogrel (PLAVIX) 75 MG tablet TAKE 1 TABLET EVERY DAY  90 tablet  3  . folic acid (FOLVITE) 267 MCG tablet Take 400 mcg by mouth daily.        . metoprolol tartrate (LOPRESSOR) 25 MG tablet TAKE 1/2 TABLET EVERY MORNING AND 1 TABLET AT BEDTIME  135  tablet  1  . niacin (NIASPAN) 1000 MG CR tablet TAKE 1 TABLET AT BEDTIME  90 tablet  1  . ramipril (ALTACE) 2.5 MG capsule TAKE ONE CAPSULE EVERY DAY  90 capsule  3  . ammonium lactate (AMLACTIN) 12 % cream APPLY TO AFFECTED AREA 2-3 TIMES DAILY  385 g  0  . calcium carbonate (TUMS - DOSED IN MG ELEMENTAL CALCIUM) 500 MG chewable tablet Chew 1,000 mg by mouth daily.        . Multiple Vitamin (MULTIVITAMIN) capsule Take 1 capsule by mouth daily.        . Tamsulosin HCl (FLOMAX) 0.4 MG CAPS Take 1 capsule (0.4 mg total) by mouth at bedtime.  30 capsule  2   No current facility-administered medications on file prior to visit.     REVIEW OF SYSTEMS: Cardiovascular: No chest pain, chest pressure, palpitations, orthopnea, or dyspnea on exertion. No claudication or rest pain, positive for varicose veins Pulmonary: No productive cough, asthma or wheezing. Neurologic: No weakness, paresthesias, aphasia, or amaurosis. No dizziness. Hematologic: No bleeding problems or clotting disorders. Musculoskeletal: No joint pain or joint swelling. Gastrointestinal: No blood in stool or hematemesis Genitourinary: No dysuria or hematuria. Psychiatric:: No history of major depression. Integumentary: No rashes or ulcers. Constitutional: No fever or chills.  PHYSICAL EXAMINATION:   Vital signs are BP 100/67  Pulse 80  Ht 6\' 1"  (1.854 m)  Wt 156 lb 3.2 oz (70.852 kg)  BMI 20.61 kg/m2  SpO2 97% General: The patient appears their stated age. HEENT:  No gross abnormalities Pulmonary:  Non labored breathing Abdomen: Soft and non-tender Musculoskeletal: There are no major deformities. Neurologic: No focal weakness or paresthesias are detected, Skin: There are no ulcer or rashes noted. Psychiatric: The patient has normal affect. Cardiovascular: There is a regular rate and rhythm without significant murmur appreciated.  No carotid bruits.  I cannot palpate pedal pulses   Diagnostic Studies Ultrasound was  repeated here which shows a 5.2 cm infrarenal abdominal aortic aneurysm.  Carotid duplex: I reviewed this study which showed no significant carotid disease.  Assessment: Abdominal aortic aneurysm Plan: The patient will need to get a CT angiogram to better size his aneurysm.  Hopefully this will shed some light on whether or not he is a candidate for  endovascular repair.  I discussed the need for possible angiography to determine whether or not I can get a sheath through his previously placed stents.  The patient understands he may be a better candidate for an open operation.  He will followup with me once he has completed a CT scan, likely within the next one to 2 weeks.  Eldridge Abrahams, M.D. [facility] Office: [phone] Pager:  [phone]

## 2014-01-14 NOTE — Telephone Encounter (Signed)
Gave pt appt information: Labs 2/26 Geanie Cooley (order sent by fax) [phone] [facility] 01/21/14 12:15 pm no food 4 hrs prior, liquids ok [facility] 01/21/14 2 pm - Pt wrote down info. Verbalized understanding.

## 2014-01-14 NOTE — Addendum Note (Signed)
Addended by: Dorthula Rue L on: 01/14/2014 11:57 AM   Modules accepted: Orders

## 2014-01-18 ENCOUNTER — Encounter: Payer: Self-pay | Admitting: Surgery

## 2014-01-21 ENCOUNTER — Ambulatory Visit: Payer: 59 | Admitting: Surgery

## 2014-01-21 ENCOUNTER — Encounter: Payer: Self-pay | Admitting: Surgery

## 2014-01-21 ENCOUNTER — Other Ambulatory Visit: Payer: Self-pay | Admitting: Surgery

## 2014-01-21 ENCOUNTER — Ambulatory Visit
Admission: RE | Admit: 2014-01-21 | Discharge: 2014-01-21 | Disposition: A | Discharge status: Home / Self Care | Payer: 59 | Source: Ambulatory Visit | Attending: Surgery | Admitting: Surgery

## 2014-01-21 ENCOUNTER — Ambulatory Visit (INDEPENDENT_AMBULATORY_CARE_PROVIDER_SITE_OTHER): Payer: 59 | Admitting: Surgery

## 2014-01-21 VITALS — BP 109/74 | HR 85 | Ht 73.0 in | Wt 158.5 lb

## 2014-01-21 DIAGNOSIS — I714 Abdominal aortic aneurysm, without rupture, unspecified: Secondary | ICD-10-CM

## 2014-01-21 DIAGNOSIS — Z48812 Encounter for surgical aftercare following surgery on the circulatory system: Secondary | ICD-10-CM

## 2014-01-21 MED ORDER — IOHEXOL 350 MG/ML SOLN
80.0000 mL | Freq: Once | INTRAVENOUS | Status: AC | PRN
  Administered 2014-01-21: 80 mL via INTRAVENOUS

## 2014-01-21 NOTE — Progress Notes (Signed)
Patient name: Jim [name] MRN: [id number] DOB: 1946/09/09 Sex: male     Chief Complaint  Patient presents with  . Re-evaluation    1-2 wk f/u w/ CTA abd/pelvis prior    HISTORY OF PRESENT ILLNESS: The patient is back today for followup.  He is status post bilateral carotid endarterectomy.  He has a known abdominal aortic aneurysm.  He was recently sent for a CT scan to better define his anatomy.  He has previously placed percutaneous stenting of his iliac system for occlusive disease.  Past Medical History  Diagnosis Date  . Prostate cancer 2012    XRT, seed implants  . Carotid artery disease   . PVD (peripheral vascular disease)     Known iliac and SFA disease as well as a moderately sized abdominal aortic aneurism.  Marland Kitchen CAD (coronary artery disease)         . AAA (abdominal aortic aneurysm)     Infrarenal abdominal aortic aneurysm of 48 mm, ct 5/12, followed by Dr. Quay Burow.  . Hyperlipidemia   . Hypertension   . Myocardial infarction   . Neuromuscular disorder 2002  . Hx of radiation therapy 01/13/11-03/09/11    prostate  . Arthritis   . Hx of echocardiogram 01/26/2010    showed EF of 45% to 50% with basal and mid inferior/inferoseptal hypokinesia.  Marland Kitchen History of stress test 10/27/10    showed inferior posterior scar without ischemia, EF was 31% according to the myoview  . Tobacco abuse     Past Surgical History  Procedure Laterality Date  . Inguinal hernia repair      bilateral  . Hernia repair      periumbilical  . Coronary artery bypass graft  2001    with a LIMA to his LAD, vein to an obtuse marginal branch and to the RCA  . Bilateral lower extremity stents      right common femoral and left external iliac  . Carotid endarterectomy Bilateral     bilateral, right in 2009 and left in 2001  . Colonoscopy  08/09/2011    Procedure: COLONOSCOPY;  Surgeon: Daneil Dolin, MD;  Location: [facility];  Service: Endoscopy;  Laterality: N/A;  9:15  .  Cholecystectomy      History   Social History  . Marital Status: Single    Spouse Name: N/A    Number of Children: 0  . Years of Education: N/A   Occupational History  .  [employer]   Social History Main Topics  . Smoking status: Former Smoker -- 1.00 packs/day for 15 years    Types: Cigarettes    Quit date: 11/23/2003  . Smokeless tobacco: Not on file     Comment: quit 10 years ago  . Alcohol Use: Yes     Comment: occasional  . Drug Use: No  . Sexual Activity: Not on file   Other Topics Concern  . Not on file   Social History Narrative  . No narrative on file    Family History  Problem Relation Age of Onset  . Heart attack Father   . Prostate cancer Father     mets to bone  . Cancer Father   . Heart disease Father   . Cancer Mother     ?    Allergies as of 01/21/2014  . (No Known Allergies)    Current Outpatient Prescriptions on File Prior to Visit  Medication Sig Dispense Refill  . ammonium  lactate (AMLACTIN) 12 % cream APPLY TO AFFECTED AREA 2-3 TIMES DAILY  385 g  0  . aspirin 81 MG tablet Take 81 mg by mouth daily.        . atorvastatin (LIPITOR) 80 MG tablet Take 1 tablet (80 mg total) by mouth daily.  90 tablet  1  . calcium carbonate (TUMS - DOSED IN MG ELEMENTAL CALCIUM) 500 MG chewable tablet Chew 1,000 mg by mouth daily.        . clopidogrel (PLAVIX) 75 MG tablet TAKE 1 TABLET EVERY DAY  90 tablet  3  . folic acid (FOLVITE) 800 MCG tablet Take 400 mcg by mouth daily.        . metoprolol tartrate (LOPRESSOR) 25 MG tablet TAKE 1/2 TABLET EVERY MORNING AND 1 TABLET AT BEDTIME  135 tablet  1  . Multiple Vitamin (MULTIVITAMIN) capsule Take 1 capsule by mouth daily.        . niacin (NIASPAN) 1000 MG CR tablet TAKE 1 TABLET AT BEDTIME  90 tablet  1  . ramipril (ALTACE) 2.5 MG capsule TAKE ONE CAPSULE EVERY DAY  90 capsule  3  . Tamsulosin HCl (FLOMAX) 0.4 MG CAPS Take 1 capsule (0.4 mg total) by mouth at bedtime.  30 capsule  2   No current  facility-administered medications on file prior to visit.     REVIEW OF SYSTEMS: No change from prior visit.  PHYSICAL EXAMINATION:   Vital signs are BP 109/74  Pulse 85  Ht 6' 1" (1.854 m)  Wt 158 lb 8 oz (71.895 kg)  BMI 20.92 kg/m2  SpO2 96% General: The patient appears their stated age. HEENT:  No gross abnormalities Pulmonary:  Non labored breathing Abdomen: Soft and non-tender Musculoskeletal: There are no major deformities. Neurologic: No focal weakness or paresthesias are detected, Skin: There are no ulcer or rashes noted. Psychiatric: The patient has normal affect.    Diagnostic Studies I have reviewed his CT angiogram which shows a 5.5 cm infrarenal abdominal aortic aneurysm  Assessment: Abdominal aortic aneurysm Plan: I discussed with the patient that I need to get better evaluation of his iliac system to determine whether or not he will be a good candidate for endovascular repair.  Specifically, I need to determine if I can get a stent through the previously placed stents.  I've also worried about the external iliac stent on the left side and where I could land A. endograft.  I have scheduled his aortogram for Tuesday, March 10.  I'll perform an aortogram with bilateral runoff.  Once this is been done, I will better be able to determine if he can undergo a endovascular repair or whether or not he needs an open repair.  I will discussed the details of the procedure for aneurysm repair after the angiogram with the patient while he is in the hospital so that we can schedule his surgery to get this taken care of.  V. Wells Monie Shere IV, M.D. [facility] Office: [phone] Pager:  [phone]   2 

## 2014-01-22 ENCOUNTER — Other Ambulatory Visit: Payer: Self-pay | Admitting: *Deleted

## 2014-01-22 ENCOUNTER — Encounter (HOSPITAL_COMMUNITY): Payer: Self-pay | Admitting: Pharmacy Technician

## 2014-01-29 ENCOUNTER — Encounter (HOSPITAL_COMMUNITY)
Admission: RE | Disposition: A | Discharge status: Home / Self Care | Payer: Self-pay | Source: Ambulatory Visit | Attending: Surgery

## 2014-01-29 ENCOUNTER — Ambulatory Visit (HOSPITAL_COMMUNITY)
Admission: RE | Admit: 2014-01-29 | Discharge: 2014-01-29 | Disposition: A | Discharge status: Home / Self Care | Payer: 59 | Source: Ambulatory Visit | Attending: Surgery | Admitting: Surgery

## 2014-01-29 DIAGNOSIS — I252 Old myocardial infarction: Secondary | ICD-10-CM | POA: Insufficient documentation

## 2014-01-29 DIAGNOSIS — I70209 Unspecified atherosclerosis of native arteries of extremities, unspecified extremity: Secondary | ICD-10-CM | POA: Insufficient documentation

## 2014-01-29 DIAGNOSIS — Z9889 Other specified postprocedural states: Secondary | ICD-10-CM | POA: Insufficient documentation

## 2014-01-29 DIAGNOSIS — I2510 Atherosclerotic heart disease of native coronary artery without angina pectoris: Secondary | ICD-10-CM | POA: Insufficient documentation

## 2014-01-29 DIAGNOSIS — I714 Abdominal aortic aneurysm, without rupture, unspecified: Secondary | ICD-10-CM

## 2014-01-29 DIAGNOSIS — Z923 Personal history of irradiation: Secondary | ICD-10-CM | POA: Insufficient documentation

## 2014-01-29 DIAGNOSIS — Z87891 Personal history of nicotine dependence: Secondary | ICD-10-CM | POA: Insufficient documentation

## 2014-01-29 DIAGNOSIS — I10 Essential (primary) hypertension: Secondary | ICD-10-CM | POA: Insufficient documentation

## 2014-01-29 DIAGNOSIS — I723 Aneurysm of iliac artery: Secondary | ICD-10-CM | POA: Insufficient documentation

## 2014-01-29 DIAGNOSIS — Z8546 Personal history of malignant neoplasm of prostate: Secondary | ICD-10-CM | POA: Insufficient documentation

## 2014-01-29 DIAGNOSIS — Z7902 Long term (current) use of antithrombotics/antiplatelets: Secondary | ICD-10-CM | POA: Insufficient documentation

## 2014-01-29 DIAGNOSIS — Z7982 Long term (current) use of aspirin: Secondary | ICD-10-CM | POA: Insufficient documentation

## 2014-01-29 DIAGNOSIS — E785 Hyperlipidemia, unspecified: Secondary | ICD-10-CM | POA: Insufficient documentation

## 2014-01-29 HISTORY — PX: ABDOMINAL AORTAGRAM: SHX5454

## 2014-01-29 LAB — POCT I-STAT, CHEM 8
BUN: 8 mg/dL (ref 6–23)
CALCIUM ION: 1.24 mmol/L (ref 1.13–1.30)
Chloride: 101 mEq/L (ref 96–112)
Creatinine, Ser: 1 mg/dL (ref 0.50–1.35)
GLUCOSE: 102 mg/dL — AB (ref 70–99)
HEMATOCRIT: 44 % (ref 39.0–52.0)
HEMOGLOBIN: 15 g/dL (ref 13.0–17.0)
Potassium: 4.6 mEq/L (ref 3.7–5.3)
Sodium: 141 mEq/L (ref 137–147)
TCO2: 28 mmol/L (ref 0–100)

## 2014-01-29 SURGERY — ABDOMINAL AORTAGRAM
Anesthesia: LOCAL

## 2014-01-29 MED ORDER — SODIUM CHLORIDE 0.9 % IV SOLN: 1.0000 mL/kg/h | INTRAVENOUS | Status: DC

## 2014-01-29 MED ORDER — MIDAZOLAM HCL 2 MG/2ML IJ SOLN
INTRAMUSCULAR | Status: AC
  Filled 2014-01-29: qty 2

## 2014-01-29 MED ORDER — HEPARIN (PORCINE) IN NACL 2-0.9 UNIT/ML-% IJ SOLN
INTRAMUSCULAR | Status: AC
  Filled 2014-01-29: qty 1000

## 2014-01-29 MED ORDER — LIDOCAINE HCL (PF) 1 % IJ SOLN
INTRAMUSCULAR | Status: AC
  Filled 2014-01-29: qty 30

## 2014-01-29 MED ORDER — SODIUM CHLORIDE 0.9 % IV SOLN
INTRAVENOUS | Status: DC
  Administered 2014-01-29: 10:00:00 via INTRAVENOUS

## 2014-01-29 MED ORDER — FENTANYL CITRATE 0.05 MG/ML IJ SOLN
INTRAMUSCULAR | Status: AC
  Filled 2014-01-29: qty 2

## 2014-01-29 SURGICAL SUPPLY — 53 items
BANDAGE ELASTIC 4 VELCRO ST LF (GAUZE/BANDAGES/DRESSINGS) IMPLANT
BANDAGE ESMARK 6X9 LF (GAUZE/BANDAGES/DRESSINGS) IMPLANT
BNDG ESMARK 6X9 LF (GAUZE/BANDAGES/DRESSINGS)
CANISTER SUCTION 2500CC (MISCELLANEOUS) ×2 IMPLANT
CLIP TI MEDIUM 24 (CLIP) ×2 IMPLANT
CLIP TI WIDE RED SMALL 24 (CLIP) ×2 IMPLANT
COVER SURGICAL LIGHT HANDLE (MISCELLANEOUS) ×2 IMPLANT
CUFF TOURNIQUET SINGLE 24IN (TOURNIQUET CUFF) IMPLANT
CUFF TOURNIQUET SINGLE 34IN LL (TOURNIQUET CUFF) IMPLANT
CUFF TOURNIQUET SINGLE 44IN (TOURNIQUET CUFF) IMPLANT
DERMABOND ADVANCED (GAUZE/BANDAGES/DRESSINGS) ×1
DERMABOND ADVANCED .7 DNX12 (GAUZE/BANDAGES/DRESSINGS) ×1 IMPLANT
DRAIN CHANNEL 15F RND FF W/TCR (WOUND CARE) IMPLANT
DRAPE WARM FLUID 44X44 (DRAPE) ×2 IMPLANT
DRAPE X-RAY CASS 24X20 (DRAPES) IMPLANT
DRSG COVADERM 4X10 (GAUZE/BANDAGES/DRESSINGS) IMPLANT
DRSG COVADERM 4X8 (GAUZE/BANDAGES/DRESSINGS) IMPLANT
ELECT REM PT RETURN 9FT ADLT (ELECTROSURGICAL) ×2
ELECTRODE REM PT RTRN 9FT ADLT (ELECTROSURGICAL) ×1 IMPLANT
EVACUATOR SILICONE 100CC (DRAIN) IMPLANT
GLOVE BIOGEL PI IND STRL 7.5 (GLOVE) ×1 IMPLANT
GLOVE BIOGEL PI INDICATOR 7.5 (GLOVE) ×1
GLOVE SURG SS PI 7.5 STRL IVOR (GLOVE) ×2 IMPLANT
GOWN PREVENTION PLUS XXLARGE (GOWN DISPOSABLE) ×2 IMPLANT
GOWN STRL NON-REIN LRG LVL3 (GOWN DISPOSABLE) ×6 IMPLANT
HEMOSTAT SNOW SURGICEL 2X4 (HEMOSTASIS) IMPLANT
KIT BASIN OR (CUSTOM PROCEDURE TRAY) ×2 IMPLANT
KIT ROOM TURNOVER OR (KITS) ×2 IMPLANT
MARKER GRAFT CORONARY BYPASS (MISCELLANEOUS) IMPLANT
NS IRRIG 1000ML POUR BTL (IV SOLUTION) ×4 IMPLANT
PACK PERIPHERAL VASCULAR (CUSTOM PROCEDURE TRAY) ×2 IMPLANT
PAD ARMBOARD 7.5X6 YLW CONV (MISCELLANEOUS) ×4 IMPLANT
PADDING CAST COTTON 6X4 STRL (CAST SUPPLIES) IMPLANT
SET COLLECT BLD 21X3/4 12 (NEEDLE) IMPLANT
STOPCOCK 4 WAY LG BORE MALE ST (IV SETS) IMPLANT
SUT ETHILON 3 0 PS 1 (SUTURE) IMPLANT
SUT PROLENE 5 0 C 1 24 (SUTURE) ×2 IMPLANT
SUT PROLENE 6 0 BV (SUTURE) ×2 IMPLANT
SUT PROLENE 7 0 BV 1 (SUTURE) IMPLANT
SUT SILK 2 0 SH (SUTURE) ×2 IMPLANT
SUT SILK 3 0 (SUTURE)
SUT SILK 3-0 18XBRD TIE 12 (SUTURE) IMPLANT
SUT VIC AB 2-0 CT1 27 (SUTURE) ×2
SUT VIC AB 2-0 CT1 TAPERPNT 27 (SUTURE) ×2 IMPLANT
SUT VIC AB 3-0 SH 27 (SUTURE) ×2
SUT VIC AB 3-0 SH 27X BRD (SUTURE) ×2 IMPLANT
SUT VICRYL 4-0 PS2 18IN ABS (SUTURE) ×4 IMPLANT
TOWEL OR 17X24 6PK STRL BLUE (TOWEL DISPOSABLE) ×4 IMPLANT
TOWEL OR 17X26 10 PK STRL BLUE (TOWEL DISPOSABLE) ×4 IMPLANT
TRAY FOLEY CATH 16FRSI W/METER (SET/KITS/TRAYS/PACK) ×2 IMPLANT
TUBING EXTENTION W/L.L. (IV SETS) IMPLANT
UNDERPAD 30X30 INCONTINENT (UNDERPADS AND DIAPERS) ×2 IMPLANT
WATER STERILE IRR 1000ML POUR (IV SOLUTION) ×2 IMPLANT

## 2014-01-29 NOTE — Interval H&P Note (Signed)
History and Physical Interval Note:  01/29/2014 12:55 PM  Jim [name]  has presented today for surgery, with the diagnosis of aaa to see if can be handled by stent  The various methods of treatment have been discussed with the patient and family. After consideration of risks, benefits and other options for treatment, the patient has consented to  Procedure(s): ABDOMINAL AORTAGRAM (N/A) as a surgical intervention .  The patient's history has been reviewed, patient examined, no change in status, stable for surgery.  I have reviewed the patient's chart and labs.  Questions were answered to the patient's satisfaction.     [name]

## 2014-01-29 NOTE — Discharge Instructions (Signed)
Angiography, Care After °Refer to this sheet in the next few weeks. These instructions provide you with information on caring for yourself after your procedure. Your health care provider may also give you more specific instructions. Your treatment has been planned according to current medical practices, but problems sometimes occur. Call your health care provider if you have any problems or questions after your procedure.  °WHAT TO EXPECT AFTER THE PROCEDURE °After your procedure, it is typical to have the following sensations: °· Minor discomfort or tenderness and a small bump at the catheter insertion site. The bump should usually decrease in size and tenderness within 1 to 2 weeks. °· Any bruising will usually fade within 2 to 4 weeks. °HOME CARE INSTRUCTIONS  °· You may need to keep taking blood thinners if they were prescribed for you. Only take over-the-counter or prescription medicines for pain, fever, or discomfort as directed by your health care provider. °· Do not apply powder or lotion to the site. °· Do not sit in a bathtub, swimming pool, or whirlpool for 5 to 7 days. °· You may shower 24 hours after the procedure. Remove the bandage (dressing) and gently wash the site with plain soap and water. Gently pat the site dry. °· Inspect the site at least twice daily. °· Limit your activity for the first 48 hours. Do not bend, squat, or lift anything over 20 lb (9 kg) or as directed by your health care provider. °· Do not drive home if you are discharged the day of the procedure. Have someone else drive you. Follow instructions about when you can drive or return to work. °SEEK MEDICAL CARE IF: °· You get lightheaded when standing up. °· You have drainage (other than a small amount of blood on the dressing). °· You have chills. °· You have a fever. °· You have redness, warmth, swelling, or pain at the insertion site. °SEEK IMMEDIATE MEDICAL CARE IF:  °· You develop chest pain or shortness of breath, feel faint,  or pass out. °· You have bleeding, swelling larger than a walnut, or drainage from the catheter insertion site. °· You develop pain, discoloration, coldness, or severe bruising in the leg or arm that held the catheter. °· You develop bleeding from any other place, such as the bowels. You may see bright red blood in your urine or stools, or your stools may appear black and tarry. °· You have heavy bleeding from the site. If this happens, hold pressure on the site. °MAKE SURE YOU: °· Understand these instructions. °· Will watch your condition. °· Will get help right away if you are not doing well or get worse. °Document Released: 05/27/2005 Document Revised: 07/11/2013 Document Reviewed: 04/02/2013 °[employer]® Patient Information ©2014 [employer]. ° °

## 2014-01-29 NOTE — Progress Notes (Signed)
Spoke with patient and family today after angiogram.  I have recommended that we proceed with EVAR.  I discussed the procedure in detail, including the risks and benefits which include but are not limited to bleeding, cardiopulmonary complications, LE ischemia, bowel ischemia, type 2 endoleak, and paralysis.  We discussed the need for long term surveillance His procedure will be scheduled next week.   Jim [name]

## 2014-01-29 NOTE — H&P (View-Only) (Signed)
Patient name: Jim [name] MRN: [id number] DOB: 1946/09/09 Sex: male     Chief Complaint  Patient presents with  . Re-evaluation    1-2 wk f/u w/ CTA abd/pelvis prior    HISTORY OF PRESENT ILLNESS: The patient is back today for followup.  He is status post bilateral carotid endarterectomy.  He has a known abdominal aortic aneurysm.  He was recently sent for a CT scan to better define his anatomy.  He has previously placed percutaneous stenting of his iliac system for occlusive disease.  Past Medical History  Diagnosis Date  . Prostate cancer 2012    XRT, seed implants  . Carotid artery disease   . PVD (peripheral vascular disease)     Known iliac and SFA disease as well as a moderately sized abdominal aortic aneurism.  Marland Kitchen CAD (coronary artery disease)         . AAA (abdominal aortic aneurysm)     Infrarenal abdominal aortic aneurysm of 48 mm, ct 5/12, followed by Dr. Quay Burow.  . Hyperlipidemia   . Hypertension   . Myocardial infarction   . Neuromuscular disorder 2002  . Hx of radiation therapy 01/13/11-03/09/11    prostate  . Arthritis   . Hx of echocardiogram 01/26/2010    showed EF of 45% to 50% with basal and mid inferior/inferoseptal hypokinesia.  Marland Kitchen History of stress test 10/27/10    showed inferior posterior scar without ischemia, EF was 31% according to the myoview  . Tobacco abuse     Past Surgical History  Procedure Laterality Date  . Inguinal hernia repair      bilateral  . Hernia repair      periumbilical  . Coronary artery bypass graft  2001    with a LIMA to his LAD, vein to an obtuse marginal branch and to the RCA  . Bilateral lower extremity stents      right common femoral and left external iliac  . Carotid endarterectomy Bilateral     bilateral, right in 2009 and left in 2001  . Colonoscopy  08/09/2011    Procedure: COLONOSCOPY;  Surgeon: Daneil Dolin, MD;  Location: [facility];  Service: Endoscopy;  Laterality: N/A;  9:15  .  Cholecystectomy      History   Social History  . Marital Status: Single    Spouse Name: N/A    Number of Children: 0  . Years of Education: N/A   Occupational History  .  [employer]   Social History Main Topics  . Smoking status: Former Smoker -- 1.00 packs/day for 15 years    Types: Cigarettes    Quit date: 11/23/2003  . Smokeless tobacco: Not on file     Comment: quit 10 years ago  . Alcohol Use: Yes     Comment: occasional  . Drug Use: No  . Sexual Activity: Not on file   Other Topics Concern  . Not on file   Social History Narrative  . No narrative on file    Family History  Problem Relation Age of Onset  . Heart attack Father   . Prostate cancer Father     mets to bone  . Cancer Father   . Heart disease Father   . Cancer Mother     ?    Allergies as of 01/21/2014  . (No Known Allergies)    Current Outpatient Prescriptions on File Prior to Visit  Medication Sig Dispense Refill  . ammonium  lactate (AMLACTIN) 12 % cream APPLY TO AFFECTED AREA 2-3 TIMES DAILY  385 g  0  . aspirin 81 MG tablet Take 81 mg by mouth daily.        Marland Kitchen atorvastatin (LIPITOR) 80 MG tablet Take 1 tablet (80 mg total) by mouth daily.  90 tablet  1  . calcium carbonate (TUMS - DOSED IN MG ELEMENTAL CALCIUM) 500 MG chewable tablet Chew 1,000 mg by mouth daily.        . clopidogrel (PLAVIX) 75 MG tablet TAKE 1 TABLET EVERY DAY  90 tablet  3  . folic acid (FOLVITE) 361 MCG tablet Take 400 mcg by mouth daily.        . metoprolol tartrate (LOPRESSOR) 25 MG tablet TAKE 1/2 TABLET EVERY MORNING AND 1 TABLET AT BEDTIME  135 tablet  1  . Multiple Vitamin (MULTIVITAMIN) capsule Take 1 capsule by mouth daily.        . niacin (NIASPAN) 1000 MG CR tablet TAKE 1 TABLET AT BEDTIME  90 tablet  1  . ramipril (ALTACE) 2.5 MG capsule TAKE ONE CAPSULE EVERY DAY  90 capsule  3  . Tamsulosin HCl (FLOMAX) 0.4 MG CAPS Take 1 capsule (0.4 mg total) by mouth at bedtime.  30 capsule  2   No current  facility-administered medications on file prior to visit.     REVIEW OF SYSTEMS: No change from prior visit.  PHYSICAL EXAMINATION:   Vital signs are BP 109/74  Pulse 85  Ht 6\' 1"  (1.854 m)  Wt 158 lb 8 oz (71.895 kg)  BMI 20.92 kg/m2  SpO2 96% General: The patient appears their stated age. HEENT:  No gross abnormalities Pulmonary:  Non labored breathing Abdomen: Soft and non-tender Musculoskeletal: There are no major deformities. Neurologic: No focal weakness or paresthesias are detected, Skin: There are no ulcer or rashes noted. Psychiatric: The patient has normal affect.    Diagnostic Studies I have reviewed his CT angiogram which shows a 5.5 cm infrarenal abdominal aortic aneurysm  Assessment: Abdominal aortic aneurysm Plan: I discussed with the patient that I need to get better evaluation of his iliac system to determine whether or not he will be a good candidate for endovascular repair.  Specifically, I need to determine if I can get a stent through the previously placed stents.  I've also worried about the external iliac stent on the left side and where I could land A. endograft.  I have scheduled his aortogram for Tuesday, March 10.  I'll perform an aortogram with bilateral runoff.  Once this is been done, I will better be able to determine if he can undergo a endovascular repair or whether or not he needs an open repair.  I will discussed the details of the procedure for aneurysm repair after the angiogram with the patient while he is in the hospital so that we can schedule his surgery to get this taken care of.  V. Leia Alf, M.D. [facility] Office: [phone] Pager:  [phone]   2

## 2014-01-29 NOTE — Op Note (Signed)
   PATIENT: Jim [name]  MRN: [id number] DOB: 27-Sep-1946    DATE OF PROCEDURE: 01/29/2014  INDICATIONS: [name] is a 68 y.o. male With a 5.5 cm infrarenal abdominal aortic aneurysm. He has had previous stents placed and presents for diagnostic arteriography in order to determine if he is a candidate for EVAR.  PROCEDURE:  1. Ultrasound-guided access to the right common femoral artery 2. Aortogram with bilateral iliac arteriogram and bilateral lower extremity runoff  SURGEON: Judeth Cornfield. Scot Dock, MD, FACS  ANESTHESIA: local with sedation   EBL: minimal  TECHNIQUE: The patient was brought to the peripheral vascular lab and sedated with 1 mg of Versed and 50 mcg of fentanyl. Both groins were prepped and draped in usual sterile fashion. Under ultrasound guidance, after the skin was anesthetized, the right common femoral artery was cannulated and a 5 French sheath introduced over the wire. The pigtail catheter was positioned at the L1 vertebral body and flush aortogram obtained. The catheter was then advanced in a lateral projection obtained. The catheter was in position with aortic bifurcation and oblique iliac projections were obtained. Next bilateral lower extremity runoff films were obtained. Next the atrial catheter was removed over a wire and additional spot films obtained of the right leg. At the completion of the procedure the patient was transferred to the holding area for removal of the sheath. No immediate complications were noted.  FINDINGS:  1. There are single renal arteries bilaterally. There is a 2 cm infrarenal neck to the aneurysm. There is a fusiform abdominal aortic aneurysm. 2. On the right side, there is a stent in the proximal right common iliac artery. This is approximately 7 mm in diameter. The external iliac artery and hypogastric artery on the right are patent. The common femoral and deep femoral artery on the right are patent. The superficial femoral  artery is occluded at its origin. There is reconstitution of a blind above-knee popliteal segment. The popliteal artery below this is then occluded and then this reconstitution of the distal popliteal, tibial peroneal trunk, with two-vessel runoff via the peroneal and posterior tibial arteries. The anterior tibial artery on the right is occluded. 3. 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 The hypogastric artery is diseased and small. There is a stent in the proximal left external iliac artery which appears to partially cover the hypogastric artery. Below the stent the external iliac artery is patent. There is mild disease at the distal common femoral artery on the left. The deep femoral artery is patent. There is diffuse disease of the superficial femoral artery proximally. The popliteal artery is patent and is three-vessel runoff on the left via the anterior tibial, posterior tibial, and peroneal arteries.  Deitra Mayo, MD, FACS [facility]  DATE OF DICTATION:   01/29/2014

## 2014-02-01 ENCOUNTER — Other Ambulatory Visit: Payer: Self-pay

## 2014-02-07 ENCOUNTER — Encounter (HOSPITAL_COMMUNITY)
Admission: RE | Admit: 2014-02-07 | Discharge: 2014-02-07 | Disposition: A | Discharge status: Home / Self Care | Payer: 59 | Source: Ambulatory Visit | Attending: Anesthesiology | Admitting: Anesthesiology

## 2014-02-07 ENCOUNTER — Encounter (HOSPITAL_COMMUNITY)
Admission: RE | Admit: 2014-02-07 | Discharge: 2014-02-07 | Disposition: A | Discharge status: Home / Self Care | Payer: 59 | Source: Ambulatory Visit | Attending: Surgery | Admitting: Surgery

## 2014-02-07 ENCOUNTER — Other Ambulatory Visit (HOSPITAL_COMMUNITY): Payer: Self-pay | Admitting: *Deleted

## 2014-02-07 ENCOUNTER — Encounter (HOSPITAL_COMMUNITY): Payer: Self-pay

## 2014-02-07 DIAGNOSIS — Z01818 Encounter for other preprocedural examination: Secondary | ICD-10-CM | POA: Insufficient documentation

## 2014-02-07 DIAGNOSIS — Z01812 Encounter for preprocedural laboratory examination: Secondary | ICD-10-CM | POA: Insufficient documentation

## 2014-02-07 HISTORY — DX: Personal history of other infectious and parasitic diseases: Z86.19

## 2014-02-07 LAB — COMPREHENSIVE METABOLIC PANEL
ALT: 25 U/L (ref 0–53)
AST: 26 U/L (ref 0–37)
Albumin: 3.3 g/dL — ABNORMAL LOW (ref 3.5–5.2)
Alkaline Phosphatase: 106 U/L (ref 39–117)
BUN: 14 mg/dL (ref 6–23)
CALCIUM: 8.9 mg/dL (ref 8.4–10.5)
CO2: 22 meq/L (ref 19–32)
CREATININE: 0.74 mg/dL (ref 0.50–1.35)
Chloride: 103 mEq/L (ref 96–112)
GLUCOSE: 87 mg/dL (ref 70–99)
Potassium: 4.5 mEq/L (ref 3.7–5.3)
Sodium: 138 mEq/L (ref 137–147)
TOTAL PROTEIN: 6 g/dL (ref 6.0–8.3)
Total Bilirubin: 0.3 mg/dL (ref 0.3–1.2)

## 2014-02-07 LAB — URINALYSIS, ROUTINE W REFLEX MICROSCOPIC
Bilirubin Urine: NEGATIVE
GLUCOSE, UA: NEGATIVE mg/dL
KETONES UR: NEGATIVE mg/dL
Leukocytes, UA: NEGATIVE
Nitrite: NEGATIVE
PROTEIN: NEGATIVE mg/dL
Specific Gravity, Urine: 1.024 (ref 1.005–1.030)
Urobilinogen, UA: 1 mg/dL (ref 0.0–1.0)
pH: 5 (ref 5.0–8.0)

## 2014-02-07 LAB — BLOOD GAS, ARTERIAL
ACID-BASE EXCESS: 0.9 mmol/L (ref 0.0–2.0)
Bicarbonate: 25.3 mEq/L — ABNORMAL HIGH (ref 20.0–24.0)
O2 Saturation: 94 %
PO2 ART: 69.4 mmHg — AB (ref 80.0–100.0)
Patient temperature: 98.6
Sample type: 24485
TCO2: 26.7 mmol/L (ref 0–100)
pCO2 arterial: 43 mmHg (ref 35.0–45.0)
pH, Arterial: 7.388 (ref 7.350–7.450)

## 2014-02-07 LAB — TYPE AND SCREEN
ABO/RH(D): A POS
Antibody Screen: NEGATIVE

## 2014-02-07 LAB — URINE MICROSCOPIC-ADD ON

## 2014-02-07 LAB — PROTIME-INR
INR: 0.88 (ref 0.00–1.49)
Prothrombin Time: 11.8 seconds (ref 11.6–15.2)

## 2014-02-07 LAB — CBC
HCT: 41.4 % (ref 39.0–52.0)
Hemoglobin: 13.4 g/dL (ref 13.0–17.0)
MCH: 30 pg (ref 26.0–34.0)
MCHC: 32.4 g/dL (ref 30.0–36.0)
MCV: 92.6 fL (ref 78.0–100.0)
PLATELETS: 135 10*3/uL — AB (ref 150–400)
RBC: 4.47 MIL/uL (ref 4.22–5.81)
RDW: 13 % (ref 11.5–15.5)
WBC: 5.4 10*3/uL (ref 4.0–10.5)

## 2014-02-07 LAB — SURGICAL PCR SCREEN
MRSA, PCR: NEGATIVE
Staphylococcus aureus: POSITIVE — AB

## 2014-02-07 LAB — APTT: APTT: 26 s (ref 24–37)

## 2014-02-07 NOTE — Pre-Procedure Instructions (Signed)
Jim [name]  02/07/2014   Your procedure is scheduled on:  Thursday, February 14, 2014 at 7:30 AM.   Report to [facility] Entrance "A" Admitting Office at 5:30 AM.   Call this number if you have problems the morning of surgery: [phone]   Remember:   Do not eat food or drink liquids after midnight Wednesday, 02/13/14.   Take these medicines the morning of surgery with A SIP OF WATER:  metoprolol tartrate (LOPRESSOR), aspirin  Stop Plavix 02/09/14 as directed by Dr. Trula Slade.               Do not wear jewelry.  Do not wear lotions, powders, or cologne. You may NOT wear deodorant.  Men may shave face and neck.  Do not bring valuables to the hospital.  [facility] is not responsible                  for any belongings or valuables.               Contacts, dentures or bridgework may not be worn into surgery.  Leave suitcase in the car. After surgery it may be brought to your room.  For patients admitted to the hospital, discharge time is determined by your                treatment team.             Special Instructions: [place] - Preparing for Surgery  Before surgery, you can play an important role.  Because skin is not sterile, your skin needs to be as free of germs as possible.  You can reduce the number of germs on you skin by washing with CHG (chlorahexidine gluconate) soap before surgery.  CHG is an antiseptic cleaner which kills germs and bonds with the skin to continue killing germs even after washing.  Please DO NOT use if you have an allergy to CHG or antibacterial soaps.  If your skin becomes reddened/irritated stop using the CHG and inform your nurse when you arrive at Short Stay.  Do not shave (including legs and underarms) for at least 48 hours prior to the first CHG shower.  You may shave your face.  Please follow these instructions carefully:   1.  Shower with CHG Soap the night before surgery and the                                morning of  Surgery.  2.  If you choose to wash your hair, wash your hair first as usual with your       normal shampoo.  3.  After you shampoo, rinse your hair and body thoroughly to remove the                      Shampoo.  4.  Use CHG as you would any other liquid soap.  You can apply chg directly       to the skin and wash gently with scrungie or a clean washcloth.  5.  Apply the CHG Soap to your body ONLY FROM THE NECK DOWN.        Do not use on open wounds or open sores.  Avoid contact with your eyes, ears, mouth and genitals (private parts).  Wash genitals (private parts) with your normal soap.  6.  Wash thoroughly, paying special attention to the area where  your surgery        will be performed.  7.  Thoroughly rinse your body with warm water from the neck down.  8.  DO NOT shower/wash with your normal soap after using and rinsing off       the CHG Soap.  9.  Pat yourself dry with a clean towel.            10.  Wear clean pajamas.            11.  Place clean sheets on your bed the night of your first shower and do not        sleep with pets.  Day of Surgery  Do not apply any lotions/deodorants the morning of surgery.  Please wear clean clothes to the hospital/surgery center.     Please read over the following fact sheets that you were given: Pain Booklet, Coughing and Deep Breathing, Blood Transfusion Information, MRSA Information and Surgical Site Infection Prevention

## 2014-02-07 NOTE — Progress Notes (Addendum)
Pt's cardiologist is Dr. Adora Fridge. Stress Test 12/05/13.   Pt states he is no longer taking Flomax, states he was told to stop after his treatment for prostate cancer was done.

## 2014-02-07 NOTE — Progress Notes (Signed)
Called Mupirocin ointment rx into [facility] [address] [place] per pt request for positive staph PCR. Left message on pt's voicemail.

## 2014-02-08 ENCOUNTER — Telehealth: Payer: Self-pay | Admitting: *Deleted

## 2014-02-08 NOTE — Telephone Encounter (Signed)
Mr. Bushnell is requesting a back to work form be faxed to [employer] (his employer) regarding an estimate of when he may be returning to work following his Abdominal Aortic Endovascular Stent Graft scheduled for 02-14-3014.  Faxed return to work form to [employer] to [phone] stating an estimated time to return to work of 03-11-2014. Mr. Valentino aware that fax was sent today.

## 2014-02-08 NOTE — Progress Notes (Signed)
Anesthesia Chart Review:  Patient is a 68 year old male scheduled for EVAR AAA on 02/14/14 by Dr. Trula Slade.   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 PCP is Dr. Sallee Lange.  Cardiologist is Dr. Quay Burow who felt he would be mild to moderated increased risk for vascular surgery. PAT notes indicate that patient reported that Dr. Trula Slade instructed him to hold Plavix on 02/09/14.  Nuclear stress test on 12/05/13 showed: Overall Impression: Intermediate risk stress nuclear study with a large inferior and lateral defect which is partially reversible - this could be hybernating myocardium or scar with peri-infarct ischemia. LV Wall Motion: Inferolateral akinesis - EF 30%. According to cardiology notes, results were similar to prior functional study performed in 2011. Medical therapy was recommended.  EKG on 11/30/13 showed NSR, possible inferior infarct (old).  Echo on 0/9/32 showed LV systolic function is borderline reduced, EF 45-50%. Basal and mid inferior and inferoseptal wall segments are severely hypokinetic.  Septal motion is consistent with post operative state, Mild diastolic dysfunction (grade 1 impaired relaxation).   Carotid duplex on 12/20/13 showed mild plaque, but no hemodynamically significant stenosis of bilateral ICA.  CXR on 02/07/14 showed no active cardiopulmonary disease, chronic bronchitic changes.  Preoperative labs noted.  If no acute changes then I anticipate that he can proceed as planned.  [facility] Short Stay Center/Anesthesiology Phone [phone] 02/08/2014 2:16 PM

## 2014-02-13 ENCOUNTER — Encounter (HOSPITAL_COMMUNITY): Payer: Self-pay | Admitting: Certified Registered Nurse Anesthetist

## 2014-02-13 MED ORDER — SODIUM CHLORIDE 0.9 % IV SOLN: INTRAVENOUS | Status: DC

## 2014-02-13 MED ORDER — DEXTROSE 5 % IV SOLN
1.5000 g | INTRAVENOUS | Status: AC
  Administered 2014-02-14: 1.5 g via INTRAVENOUS
  Filled 2014-02-13: qty 1.5

## 2014-02-14 ENCOUNTER — Other Ambulatory Visit: Payer: Self-pay | Admitting: *Deleted

## 2014-02-14 ENCOUNTER — Encounter (HOSPITAL_COMMUNITY): Payer: Self-pay | Admitting: *Deleted

## 2014-02-14 ENCOUNTER — Encounter (HOSPITAL_COMMUNITY): Payer: 59 | Admitting: Vascular Surgery

## 2014-02-14 ENCOUNTER — Inpatient Hospital Stay (HOSPITAL_COMMUNITY)
Admission: RE | Admit: 2014-02-14 | Discharge: 2014-02-15 | DRG: 238 | Disposition: A | Discharge status: Home / Self Care | Payer: 59 | Source: Ambulatory Visit | Attending: Surgery | Admitting: Surgery

## 2014-02-14 ENCOUNTER — Encounter (HOSPITAL_COMMUNITY)
Admission: RE | Disposition: A | Discharge status: Home / Self Care | Payer: Self-pay | Source: Ambulatory Visit | Attending: Surgery

## 2014-02-14 ENCOUNTER — Inpatient Hospital Stay (HOSPITAL_COMMUNITY): Payer: 59

## 2014-02-14 ENCOUNTER — Inpatient Hospital Stay (HOSPITAL_COMMUNITY): Payer: 59 | Admitting: Certified Registered Nurse Anesthetist

## 2014-02-14 DIAGNOSIS — I714 Abdominal aortic aneurysm, without rupture, unspecified: Secondary | ICD-10-CM

## 2014-02-14 DIAGNOSIS — I2510 Atherosclerotic heart disease of native coronary artery without angina pectoris: Secondary | ICD-10-CM | POA: Diagnosis present

## 2014-02-14 DIAGNOSIS — I252 Old myocardial infarction: Secondary | ICD-10-CM

## 2014-02-14 DIAGNOSIS — Z7982 Long term (current) use of aspirin: Secondary | ICD-10-CM

## 2014-02-14 DIAGNOSIS — Z48812 Encounter for surgical aftercare following surgery on the circulatory system: Secondary | ICD-10-CM

## 2014-02-14 DIAGNOSIS — Z79899 Other long term (current) drug therapy: Secondary | ICD-10-CM

## 2014-02-14 DIAGNOSIS — Z8546 Personal history of malignant neoplasm of prostate: Secondary | ICD-10-CM

## 2014-02-14 DIAGNOSIS — E785 Hyperlipidemia, unspecified: Secondary | ICD-10-CM | POA: Diagnosis present

## 2014-02-14 DIAGNOSIS — Z951 Presence of aortocoronary bypass graft: Secondary | ICD-10-CM

## 2014-02-14 DIAGNOSIS — Z87891 Personal history of nicotine dependence: Secondary | ICD-10-CM

## 2014-02-14 DIAGNOSIS — I10 Essential (primary) hypertension: Secondary | ICD-10-CM | POA: Diagnosis present

## 2014-02-14 HISTORY — PX: ABDOMINAL AORTIC ENDOVASCULAR STENT GRAFT: SHX5707

## 2014-02-14 LAB — CBC
HEMATOCRIT: 32.9 % — AB (ref 39.0–52.0)
HEMATOCRIT: 34.2 % — AB (ref 39.0–52.0)
Hemoglobin: 10.7 g/dL — ABNORMAL LOW (ref 13.0–17.0)
Hemoglobin: 11 g/dL — ABNORMAL LOW (ref 13.0–17.0)
MCH: 30 pg (ref 26.0–34.0)
MCH: 29.5 pg (ref 26.0–34.0)
MCHC: 32.5 g/dL (ref 30.0–36.0)
MCHC: 32.2 g/dL (ref 30.0–36.0)
MCV: 92.2 fL (ref 78.0–100.0)
MCV: 91.7 fL (ref 78.0–100.0)
PLATELETS: 108 10*3/uL — AB (ref 150–400)
Platelets: 128 10*3/uL — ABNORMAL LOW (ref 150–400)
RBC: 3.57 MIL/uL — AB (ref 4.22–5.81)
RBC: 3.73 MIL/uL — ABNORMAL LOW (ref 4.22–5.81)
RDW: 13 % (ref 11.5–15.5)
RDW: 13 % (ref 11.5–15.5)
WBC: 8.5 10*3/uL (ref 4.0–10.5)
WBC: 9.5 10*3/uL (ref 4.0–10.5)

## 2014-02-14 LAB — PROTIME-INR
INR: 1.11 (ref 0.00–1.49)
PROTHROMBIN TIME: 14.1 s (ref 11.6–15.2)

## 2014-02-14 LAB — CREATININE, SERUM: Creatinine, Ser: 0.75 mg/dL (ref 0.50–1.35)

## 2014-02-14 LAB — BASIC METABOLIC PANEL
BUN: 15 mg/dL (ref 6–23)
CO2: 25 mEq/L (ref 19–32)
Calcium: 8.2 mg/dL — ABNORMAL LOW (ref 8.4–10.5)
Chloride: 105 mEq/L (ref 96–112)
Creatinine, Ser: 0.76 mg/dL (ref 0.50–1.35)
GFR calc non Af Amer: >90 mL/min (ref >90)
Glucose, Bld: 135 mg/dL — ABNORMAL HIGH (ref 70–99)
POTASSIUM: 3.8 meq/L (ref 3.7–5.3)
Sodium: 140 mEq/L (ref 137–147)

## 2014-02-14 LAB — MAGNESIUM: Magnesium: 1.6 mg/dL (ref 1.5–2.5)

## 2014-02-14 LAB — APTT: APTT: 34 s (ref 24–37)

## 2014-02-14 SURGERY — INSERTION, ENDOVASCULAR STENT GRAFT, AORTA, ABDOMINAL
Anesthesia: General | Site: Groin | Laterality: Bilateral

## 2014-02-14 MED ORDER — LACTATED RINGERS IV SOLN
INTRAVENOUS | Status: DC | PRN
  Administered 2014-02-14 (×2): via INTRAVENOUS

## 2014-02-14 MED ORDER — FENTANYL CITRATE 0.05 MG/ML IJ SOLN
INTRAMUSCULAR | Status: AC
  Filled 2014-02-14: qty 5

## 2014-02-14 MED ORDER — FENTANYL CITRATE 0.05 MG/ML IJ SOLN
INTRAMUSCULAR | Status: DC | PRN
  Administered 2014-02-14 (×2): 50 ug via INTRAVENOUS

## 2014-02-14 MED ORDER — NEOSTIGMINE METHYLSULFATE 1 MG/ML IJ SOLN
INTRAMUSCULAR | Status: AC
  Filled 2014-02-14: qty 10

## 2014-02-14 MED ORDER — ROCURONIUM BROMIDE 50 MG/5ML IV SOLN
INTRAVENOUS | Status: AC
  Filled 2014-02-14: qty 1

## 2014-02-14 MED ORDER — SENNOSIDES-DOCUSATE SODIUM 8.6-50 MG PO TABS
1.0000 | ORAL_TABLET | Freq: Every evening | ORAL | Status: DC | PRN
  Filled 2014-02-14: qty 1

## 2014-02-14 MED ORDER — SODIUM CHLORIDE 0.9 % IV SOLN: INTRAVENOUS | Status: DC

## 2014-02-14 MED ORDER — PROPOFOL 10 MG/ML IV BOLUS
INTRAVENOUS | Status: DC | PRN
  Administered 2014-02-14: 100 mg via INTRAVENOUS

## 2014-02-14 MED ORDER — OXYCODONE HCL 5 MG PO TABS: 5.0000 mg | ORAL_TABLET | Freq: Once | ORAL | Status: DC | PRN

## 2014-02-14 MED ORDER — METOPROLOL TARTRATE 1 MG/ML IV SOLN: 2.0000 mg | INTRAVENOUS | Status: DC | PRN

## 2014-02-14 MED ORDER — ONDANSETRON HCL 4 MG/2ML IJ SOLN
INTRAMUSCULAR | Status: AC
  Filled 2014-02-14: qty 2

## 2014-02-14 MED ORDER — RAMIPRIL 2.5 MG PO CAPS
2.5000 mg | ORAL_CAPSULE | Freq: Every day | ORAL | Status: DC
  Administered 2014-02-14: 2.5 mg via ORAL
  Filled 2014-02-14 (×2): qty 1

## 2014-02-14 MED ORDER — GUAIFENESIN-DM 100-10 MG/5ML PO SYRP: 15.0000 mL | ORAL_SOLUTION | ORAL | Status: DC | PRN

## 2014-02-14 MED ORDER — VASOPRESSIN 20 UNIT/ML IJ SOLN
INTRAMUSCULAR | Status: DC | PRN
  Administered 2014-02-14 (×4): 1 [IU] via INTRAVENOUS

## 2014-02-14 MED ORDER — HYDROMORPHONE HCL PF 1 MG/ML IJ SOLN: 0.2500 mg | INTRAMUSCULAR | Status: DC | PRN

## 2014-02-14 MED ORDER — ASPIRIN 81 MG PO TABS: 81.0000 mg | ORAL_TABLET | Freq: Every day | ORAL | Status: DC

## 2014-02-14 MED ORDER — PROPOFOL 10 MG/ML IV BOLUS
INTRAVENOUS | Status: AC
  Filled 2014-02-14: qty 20

## 2014-02-14 MED ORDER — PHENYLEPHRINE HCL 10 MG/ML IJ SOLN
INTRAMUSCULAR | Status: AC
  Filled 2014-02-14: qty 1

## 2014-02-14 MED ORDER — SODIUM CHLORIDE 0.9 % IR SOLN
Status: DC | PRN
  Administered 2014-02-14: 08:00:00

## 2014-02-14 MED ORDER — PROMETHAZINE HCL 25 MG/ML IJ SOLN: 6.2500 mg | INTRAMUSCULAR | Status: DC | PRN

## 2014-02-14 MED ORDER — LABETALOL HCL 5 MG/ML IV SOLN: 10.0000 mg | INTRAVENOUS | Status: DC | PRN

## 2014-02-14 MED ORDER — PHENOL 1.4 % MT LIQD: 1.0000 | OROMUCOSAL | Status: DC | PRN

## 2014-02-14 MED ORDER — ALBUMIN HUMAN 5 % IV SOLN
INTRAVENOUS | Status: DC | PRN
  Administered 2014-02-14 (×2): via INTRAVENOUS

## 2014-02-14 MED ORDER — GLYCOPYRROLATE 0.2 MG/ML IJ SOLN
INTRAMUSCULAR | Status: AC
  Filled 2014-02-14: qty 4

## 2014-02-14 MED ORDER — MIDAZOLAM HCL 5 MG/5ML IJ SOLN
INTRAMUSCULAR | Status: DC | PRN
  Administered 2014-02-14: 2 mg via INTRAVENOUS

## 2014-02-14 MED ORDER — GLYCOPYRROLATE 0.2 MG/ML IJ SOLN
INTRAMUSCULAR | Status: DC | PRN
  Administered 2014-02-14: .8 mg via INTRAVENOUS

## 2014-02-14 MED ORDER — BISACODYL 10 MG RE SUPP: 10.0000 mg | Freq: Every day | RECTAL | Status: DC | PRN

## 2014-02-14 MED ORDER — ONDANSETRON HCL 4 MG/2ML IJ SOLN: 4.0000 mg | Freq: Four times a day (QID) | INTRAMUSCULAR | Status: DC | PRN

## 2014-02-14 MED ORDER — ASPIRIN EC 81 MG PO TBEC
81.0000 mg | DELAYED_RELEASE_TABLET | Freq: Every day | ORAL | Status: DC
  Administered 2014-02-14: 81 mg via ORAL
  Filled 2014-02-14 (×2): qty 1

## 2014-02-14 MED ORDER — ENOXAPARIN SODIUM 40 MG/0.4ML ~~LOC~~ SOLN
40.0000 mg | SUBCUTANEOUS | Status: DC
  Administered 2014-02-15: 40 mg via SUBCUTANEOUS
  Filled 2014-02-14 (×2): qty 0.4

## 2014-02-14 MED ORDER — NEOSTIGMINE METHYLSULFATE 1 MG/ML IJ SOLN
INTRAMUSCULAR | Status: DC | PRN
  Administered 2014-02-14: 4 mg via INTRAVENOUS

## 2014-02-14 MED ORDER — CALCIUM CARBONATE ANTACID 500 MG PO CHEW
1000.0000 mg | CHEWABLE_TABLET | Freq: Every day | ORAL | Status: DC
  Administered 2014-02-14: 1000 mg via ORAL
  Filled 2014-02-14 (×2): qty 2

## 2014-02-14 MED ORDER — ACETAMINOPHEN 325 MG PO TABS: 325.0000 mg | ORAL_TABLET | ORAL | Status: DC | PRN

## 2014-02-14 MED ORDER — CLOPIDOGREL BISULFATE 75 MG PO TABS
75.0000 mg | ORAL_TABLET | Freq: Every day | ORAL | Status: DC
  Filled 2014-02-14: qty 1

## 2014-02-14 MED ORDER — NIACIN ER (ANTIHYPERLIPIDEMIC) 1000 MG PO TBCR
1000.0000 mg | EXTENDED_RELEASE_TABLET | Freq: Every day | ORAL | Status: DC
Start: 2014-02-14 — End: 2014-02-14

## 2014-02-14 MED ORDER — LIDOCAINE HCL (CARDIAC) 20 MG/ML IV SOLN
INTRAVENOUS | Status: DC | PRN
  Administered 2014-02-14: 100 mg via INTRAVENOUS

## 2014-02-14 MED ORDER — ACETAMINOPHEN 325 MG RE SUPP
325.0000 mg | RECTAL | Status: DC | PRN
  Filled 2014-02-14: qty 2

## 2014-02-14 MED ORDER — PANTOPRAZOLE SODIUM 40 MG PO TBEC
40.0000 mg | DELAYED_RELEASE_TABLET | Freq: Every day | ORAL | Status: DC
  Administered 2014-02-14: 40 mg via ORAL
  Filled 2014-02-14 (×2): qty 1

## 2014-02-14 MED ORDER — HEPARIN SODIUM (PORCINE) 1000 UNIT/ML IJ SOLN
INTRAMUSCULAR | Status: DC | PRN
  Administered 2014-02-14: 5000 [IU] via INTRAVENOUS

## 2014-02-14 MED ORDER — DOPAMINE-DEXTROSE 3.2-5 MG/ML-% IV SOLN
3.0000 ug/kg/min | INTRAVENOUS | Status: DC
Start: 2014-02-14 — End: 2014-02-15

## 2014-02-14 MED ORDER — LACTATED RINGERS IV SOLN
INTRAVENOUS | Status: DC | PRN
  Administered 2014-02-14: 07:00:00 via INTRAVENOUS

## 2014-02-14 MED ORDER — IODIXANOL 320 MG/ML IV SOLN
INTRAVENOUS | Status: DC | PRN
  Administered 2014-02-14: 63.56 mL via INTRA_ARTERIAL

## 2014-02-14 MED ORDER — MIDAZOLAM HCL 2 MG/2ML IJ SOLN
INTRAMUSCULAR | Status: AC
  Filled 2014-02-14: qty 2

## 2014-02-14 MED ORDER — METOPROLOL TARTRATE 12.5 MG HALF TABLET
12.5000 mg | ORAL_TABLET | Freq: Two times a day (BID) | ORAL | Status: DC
  Administered 2014-02-14: 12.5 mg via ORAL
  Filled 2014-02-14 (×3): qty 2

## 2014-02-14 MED ORDER — DEXTROSE 5 % IV SOLN
1.5000 g | Freq: Two times a day (BID) | INTRAVENOUS | Status: AC
  Administered 2014-02-14 – 2014-02-15 (×2): 1.5 g via INTRAVENOUS
  Filled 2014-02-14 (×2): qty 1.5

## 2014-02-14 MED ORDER — TAMSULOSIN HCL 0.4 MG PO CAPS
0.4000 mg | ORAL_CAPSULE | Freq: Every day | ORAL | Status: DC
  Filled 2014-02-14 (×2): qty 1

## 2014-02-14 MED ORDER — ALUM & MAG HYDROXIDE-SIMETH 200-200-20 MG/5ML PO SUSP
15.0000 mL | ORAL | Status: DC | PRN
Start: 2014-02-14 — End: 2014-02-15
  Administered 2014-02-15: 30 mL via ORAL
  Filled 2014-02-14: qty 30

## 2014-02-14 MED ORDER — SODIUM CHLORIDE 0.9 % IV SOLN
500.0000 mL | Freq: Once | INTRAVENOUS | Status: AC | PRN
Start: 2014-02-14 — End: 2014-02-14
  Administered 2014-02-14: 500 mL via INTRAVENOUS

## 2014-02-14 MED ORDER — ONDANSETRON HCL 4 MG/2ML IJ SOLN
INTRAMUSCULAR | Status: DC | PRN
  Administered 2014-02-14: 4 mg via INTRAVENOUS

## 2014-02-14 MED ORDER — PROTAMINE SULFATE 10 MG/ML IV SOLN
INTRAVENOUS | Status: DC | PRN
  Administered 2014-02-14: 50 mg via INTRAVENOUS

## 2014-02-14 MED ORDER — PHENYLEPHRINE HCL 10 MG/ML IJ SOLN
10.0000 mg | INTRAVENOUS | Status: DC | PRN
  Administered 2014-02-14: 50 ug/min via INTRAVENOUS

## 2014-02-14 MED ORDER — LIDOCAINE HCL (CARDIAC) 20 MG/ML IV SOLN
INTRAVENOUS | Status: AC
  Filled 2014-02-14: qty 5

## 2014-02-14 MED ORDER — MAGNESIUM SULFATE 40 MG/ML IJ SOLN: 2.0000 g | Freq: Every day | INTRAMUSCULAR | Status: DC | PRN

## 2014-02-14 MED ORDER — POTASSIUM CHLORIDE CRYS ER 20 MEQ PO TBCR: 20.0000 meq | EXTENDED_RELEASE_TABLET | Freq: Every day | ORAL | Status: DC | PRN

## 2014-02-14 MED ORDER — ROCURONIUM BROMIDE 100 MG/10ML IV SOLN
INTRAVENOUS | Status: DC | PRN
  Administered 2014-02-14: 50 mg via INTRAVENOUS

## 2014-02-14 MED ORDER — HYDRALAZINE HCL 20 MG/ML IJ SOLN: 10.0000 mg | INTRAMUSCULAR | Status: DC | PRN

## 2014-02-14 MED ORDER — EPHEDRINE SULFATE 50 MG/ML IJ SOLN
INTRAMUSCULAR | Status: DC | PRN
  Administered 2014-02-14 (×4): 5 mg via INTRAVENOUS

## 2014-02-14 MED ORDER — OXYCODONE HCL 5 MG/5ML PO SOLN: 5.0000 mg | Freq: Once | ORAL | Status: DC | PRN

## 2014-02-14 MED ORDER — ATORVASTATIN CALCIUM 80 MG PO TABS
80.0000 mg | ORAL_TABLET | Freq: Every day | ORAL | Status: DC
  Administered 2014-02-14: 80 mg via ORAL
  Filled 2014-02-14 (×2): qty 1

## 2014-02-14 MED ORDER — OXYCODONE-ACETAMINOPHEN 5-325 MG PO TABS
1.0000 | ORAL_TABLET | ORAL | Status: DC | PRN
  Administered 2014-02-14: 1 via ORAL
  Filled 2014-02-14: qty 2
  Filled 2014-02-14: qty 1
  Filled 2014-02-14: qty 2

## 2014-02-14 MED ORDER — NIACIN ER 500 MG PO CPCR
1000.0000 mg | ORAL_CAPSULE | Freq: Every day | ORAL | Status: DC
  Administered 2014-02-14: 1000 mg via ORAL
  Filled 2014-02-14 (×2): qty 2

## 2014-02-14 MED ORDER — DOCUSATE SODIUM 100 MG PO CAPS
100.0000 mg | ORAL_CAPSULE | Freq: Every day | ORAL | Status: DC
  Filled 2014-02-14: qty 1

## 2014-02-14 MED ORDER — EPHEDRINE SULFATE 50 MG/ML IJ SOLN
INTRAMUSCULAR | Status: AC
  Filled 2014-02-14: qty 1

## 2014-02-14 MED ORDER — 0.9 % SODIUM CHLORIDE (POUR BTL) OPTIME
TOPICAL | Status: DC | PRN
  Administered 2014-02-14: 1000 mL

## 2014-02-14 MED ORDER — MORPHINE SULFATE 2 MG/ML IJ SOLN: 2.0000 mg | INTRAMUSCULAR | Status: DC | PRN

## 2014-02-14 SURGICAL SUPPLY — 85 items
BAG BANDED W/RUBBER/TAPE 36X54 (MISCELLANEOUS) ×3 IMPLANT
BAG SNAP BAND KOVER 36X36 (MISCELLANEOUS) ×9 IMPLANT
BALLN MUSTANG 10.0X40 75 (BALLOONS) ×2
BALLN MUSTANG 10.0X40 75CM (BALLOONS) ×1
BALLOON MUSTANG 10.0X40 75 (BALLOONS) ×1 IMPLANT
BLADE SURG CLIPPER 3M 9600 (MISCELLANEOUS) ×3 IMPLANT
CANISTER SUCTION 2500CC (MISCELLANEOUS) ×3 IMPLANT
CATH BEACON 5.038 65CM KMP-01 (CATHETERS) ×3 IMPLANT
CATH OMNI FLUSH .035X70CM (CATHETERS) ×3 IMPLANT
CLIP TI MEDIUM 24 (CLIP) IMPLANT
CLIP TI WIDE RED SMALL 24 (CLIP) IMPLANT
COVER DOME SNAP 22 D (MISCELLANEOUS) ×3 IMPLANT
COVER MAYO STAND STRL (DRAPES) ×3 IMPLANT
COVER PROBE W GEL 5X96 (DRAPES) ×3 IMPLANT
COVER SURGICAL LIGHT HANDLE (MISCELLANEOUS) ×3 IMPLANT
DERMABOND ADVANCED (GAUZE/BANDAGES/DRESSINGS) ×4
DERMABOND ADVANCED .7 DNX12 (GAUZE/BANDAGES/DRESSINGS) ×2 IMPLANT
DEVICE CLOSURE PERCLS PRGLD 6F (VASCULAR PRODUCTS) ×6 IMPLANT
DRAPE BILATERAL SPLIT (DRAPES) ×3 IMPLANT
DRAPE TABLE COVER HEAVY DUTY (DRAPES) ×3 IMPLANT
DRESSING OPSITE X SMALL 2X3 (GAUZE/BANDAGES/DRESSINGS) ×6 IMPLANT
DRSG OPSITE POSTOP 3X4 (GAUZE/BANDAGES/DRESSINGS) ×6 IMPLANT
DRYSEAL FLEXSHEATH 12FR 33CM (SHEATH) ×2
DRYSEAL FLEXSHEATH 16FR 33CM (SHEATH) ×2
ELECT REM PT RETURN 9FT ADLT (ELECTROSURGICAL) ×6
ELECTRODE REM PT RTRN 9FT ADLT (ELECTROSURGICAL) ×2 IMPLANT
EXCLUDER TNK LEG 23MX12X18 (Endovascular Graft) ×1 IMPLANT
EXCLUDER TRUNK LEG 23MX12X18 (Endovascular Graft) ×3 IMPLANT
GAUZE SPONGE 2X2 8PLY STRL LF (GAUZE/BANDAGES/DRESSINGS) ×2 IMPLANT
GLOVE BIOGEL PI IND STRL 6.5 (GLOVE) ×1 IMPLANT
GLOVE BIOGEL PI IND STRL 7.0 (GLOVE) ×1 IMPLANT
GLOVE BIOGEL PI IND STRL 7.5 (GLOVE) ×2 IMPLANT
GLOVE BIOGEL PI INDICATOR 6.5 (GLOVE) ×2
GLOVE BIOGEL PI INDICATOR 7.0 (GLOVE) ×2
GLOVE BIOGEL PI INDICATOR 7.5 (GLOVE) ×4
GLOVE ECLIPSE 7.5 STRL STRAW (GLOVE) ×3 IMPLANT
GLOVE SS BIOGEL STRL SZ 7 (GLOVE) ×1 IMPLANT
GLOVE SUPERSENSE BIOGEL SZ 7 (GLOVE) ×2
GLOVE SURG SS PI 7.5 STRL IVOR (GLOVE) ×3 IMPLANT
GOWN STRL REUS W/ TWL LRG LVL3 (GOWN DISPOSABLE) ×2 IMPLANT
GOWN STRL REUS W/ TWL XL LVL3 (GOWN DISPOSABLE) ×1 IMPLANT
GOWN STRL REUS W/TWL LRG LVL3 (GOWN DISPOSABLE) ×4
GOWN STRL REUS W/TWL XL LVL3 (GOWN DISPOSABLE) ×2
GRAFT BALLN CATH 65CM (STENTS) ×1 IMPLANT
GRAFT EXCLUDER ILIAC (Endovascular Graft) ×3 IMPLANT
HEMOSTAT SNOW SURGICEL 2X4 (HEMOSTASIS) IMPLANT
KIT BASIN OR (CUSTOM PROCEDURE TRAY) ×3 IMPLANT
KIT ENCORE 26 ADVANTAGE (KITS) ×3 IMPLANT
KIT ROOM TURNOVER OR (KITS) ×3 IMPLANT
LEG CONTRALATERAL 16X20X13.5 (Vascular Products) ×2 IMPLANT
NEEDLE PERC 18GX7CM (NEEDLE) ×3 IMPLANT
NS IRRIG 1000ML POUR BTL (IV SOLUTION) ×3 IMPLANT
PACK AORTA (CUSTOM PROCEDURE TRAY) ×3 IMPLANT
PAD ARMBOARD 7.5X6 YLW CONV (MISCELLANEOUS) ×6 IMPLANT
PENCIL BUTTON HOLSTER BLD 10FT (ELECTRODE) IMPLANT
PERCLOSE PROGLIDE 6F (VASCULAR PRODUCTS) ×18
PROTECTION STATION PRESSURIZED (MISCELLANEOUS) ×3
SHEATH AVANTI 11CM 8FR (MISCELLANEOUS) ×3 IMPLANT
SHEATH BRITE TIP 7FRX11 (SHEATH) ×3 IMPLANT
SHEATH BRITE TIP 8FR 23CM (MISCELLANEOUS) ×3 IMPLANT
SHEATH DRYSEAL FLEX 12FR 33CM (SHEATH) ×1 IMPLANT
SHEATH DRYSEAL FLEX 16FR 33CM (SHEATH) ×1 IMPLANT
SPONGE GAUZE 2X2 STER 10/PKG (GAUZE/BANDAGES/DRESSINGS) ×4
STATION PROTECTION PRESSURIZED (MISCELLANEOUS) ×1 IMPLANT
STENT GRAFT BALLN CATH 65CM (STENTS) ×2
STENT GRAFT CONTRALAT 20X13.5 (Vascular Products) ×1 IMPLANT
STOPCOCK MORSE 400PSI 3WAY (MISCELLANEOUS) ×3 IMPLANT
SUT ETHILON 3 0 PS 1 (SUTURE) IMPLANT
SUT PROLENE 5 0 C 1 24 (SUTURE) IMPLANT
SUT VIC AB 2-0 CT1 27 (SUTURE)
SUT VIC AB 2-0 CT1 TAPERPNT 27 (SUTURE) IMPLANT
SUT VIC AB 3-0 SH 27 (SUTURE)
SUT VIC AB 3-0 SH 27X BRD (SUTURE) IMPLANT
SUT VICRYL 4-0 PS2 18IN ABS (SUTURE) ×6 IMPLANT
SYR 20CC LL (SYRINGE) ×6 IMPLANT
SYR 30ML LL (SYRINGE) IMPLANT
SYR 5ML LL (SYRINGE) IMPLANT
SYR MEDRAD MARK V 150ML (SYRINGE) ×3 IMPLANT
SYRINGE 10CC LL (SYRINGE) ×9 IMPLANT
TOWEL OR 17X24 6PK STRL BLUE (TOWEL DISPOSABLE) ×6 IMPLANT
TOWEL OR 17X26 10 PK STRL BLUE (TOWEL DISPOSABLE) ×6 IMPLANT
TRAY FOLEY CATH 16FRSI W/METER (SET/KITS/TRAYS/PACK) ×3 IMPLANT
TUBING HIGH PRESSURE 120CM (CONNECTOR) ×3 IMPLANT
WIRE AMPLATZ SS-J .035X180CM (WIRE) ×6 IMPLANT
WIRE BENTSON .035X145CM (WIRE) ×6 IMPLANT

## 2014-02-14 NOTE — Progress Notes (Signed)
Notified Dr. Tamala Julian of patient BP 102/58, hr in 70s, patient took night dose but not lopressor this am. Per Dr. Tamala Julian he does not need .

## 2014-02-14 NOTE — Progress Notes (Addendum)
ANTIBIOTIC CONSULT NOTE - INITIAL  Pharmacy Consult : May adjust antibiotic dose based on patient's renal function.  Indication:  Surgical prophylaxis  No Known Allergies  Patient Measurements: Height: 6\' 1"  (185.4 cm) Weight: 160 lb 4.8 oz (72.712 kg) IBW/kg (Calculated) : 79.9   Vital Signs: Temp: 98.2 F (36.8 C) (03/26 1914) Temp src: Oral (03/26 1914) BP: 118/41 mmHg (03/26 1856) Pulse Rate: 67 (03/26 1815) Intake/Output from previous day:   Intake/Output from this shift:    Labs:  Recent Labs  03/26/Jim 1030  WBC 8.5  HGB 10.7*  PLT 108*  CREATININE 0.76   Estimated Creatinine Clearance: 90.9 ml/min (by C-G formula based on Cr of 0.76). No results found for this basename: VANCOTROUGH, Corlis Leak, VANCORANDOM, GENTTROUGH, GENTPEAK, GENTRANDOM, TOBRATROUGH, TOBRAPEAK, TOBRARND, AMIKACINPEAK, AMIKACINTROU, AMIKACIN,  in the last 72 hours   Microbiology: Recent Results (from the past 720 hour(s))  SURGICAL PCR SCREEN     Status: Abnormal   Collection Time    03/19/Jim  9:29 AM      Result Value Ref Range Status   MRSA, PCR NEGATIVE  NEGATIVE Final   Staphylococcus aureus POSITIVE (*) NEGATIVE Final   Comment:            The Xpert SA Assay (FDA     approved for NASAL specimens     in patients over 79 years of age),     is one component of     a comprehensive surveillance     program.  Test performance has     been validated by [employer] for patients greater     than or equal to 75 year old.     It is not intended     to diagnose infection nor to     guide or monitor treatment.    Medical History: Past Medical History  Diagnosis Date  . Prostate cancer 2012    XRT, seed implants  . Carotid artery disease   . PVD (peripheral vascular disease)     Known iliac and SFA disease as well as a moderately sized abdominal aortic aneurism.  Marland Kitchen CAD (coronary artery disease)         . AAA (abdominal aortic aneurysm)     Infrarenal abdominal aortic aneurysm  of 48 mm, ct 5/12, followed by Dr. Quay Burow.  . Hyperlipidemia   . Hypertension   . Myocardial infarction   . Neuromuscular disorder 2002  . Hx of radiation therapy 01/13/11-03/09/11    prostate  . Hx of echocardiogram 01/26/2010    showed EF of 45% to 50% with basal and mid inferior/inferoseptal hypokinesia.  Marland Kitchen History of stress test 10/27/10    showed inferior posterior scar without ischemia, EF was 31% according to the myoview  . Tobacco abuse   . History of shingles     Medications:  Scheduled:  . aspirin EC  81 mg Oral Daily  . atorvastatin  80 mg Oral Daily  . calcium carbonate  1,000 mg Oral Daily  . cefUROXime (ZINACEF)  IV  1.5 g Intravenous Q12H  . [START ON 02/15/2014] clopidogrel  75 mg Oral Q breakfast  . [START ON 02/15/2014] docusate sodium  100 mg Oral Daily  . [START ON 02/15/2014] enoxaparin (LOVENOX) injection  40 mg Subcutaneous Q24H  . metoprolol tartrate  12.5-25 mg Oral BID  . niacin  1,000 mg Oral QHS  . pantoprazole  40 mg Oral Daily  . ramipril  2.5 mg Oral  Daily  . tamsulosin  0.4 mg Oral QHS   Assessment: 68 y.o Jim [name] s/p endovascular repair of abdominal aortic aneurysm.   Currently has orders for Ceftriazone 1.5 gm IV q12h x 2 doses post op.   SCr 0.76 and estimated CrCl =91 ml/min.  No renal insufficiency noted.   Plan:  No change in Ceftriaxone dosage needed.   Nicole Cella, RPh Clinical Pharmacist Pager: [phone] 02/14/2014,7:49 PM

## 2014-02-14 NOTE — Anesthesia Preprocedure Evaluation (Addendum)
Anesthesia Evaluation  Patient identified by MRN, date of birth, ID band Patient awake    Reviewed: Allergy & Precautions, H&P , NPO status , Patient's Chart, lab work & pertinent test results  History of Anesthesia Complications Negative for: history of anesthetic complications  Airway Mallampati: I TM Distance: >3 FB Neck ROM: Full    Dental  (+) Edentulous Upper, Edentulous Lower, Dental Advisory Given   Pulmonary former smoker,    Pulmonary exam normal       Cardiovascular hypertension, Pt. on medications + CAD, + Past MI and + Peripheral Vascular Disease     Neuro/Psych negative neurological ROS     GI/Hepatic negative GI ROS, Neg liver ROS,   Endo/Other  negative endocrine ROS  Renal/GU negative Renal ROS     Musculoskeletal   Abdominal   Peds  Hematology   Anesthesia Other Findings   Reproductive/Obstetrics                         Anesthesia Physical Anesthesia Plan  ASA: III  Anesthesia Plan: General   Post-op Pain Management:    Induction: Intravenous  Airway Management Planned: Oral ETT  Additional Equipment: Arterial line  Intra-op Plan:   Post-operative Plan: Extubation in OR  Informed Consent: I have reviewed the patients History and Physical, chart, labs and discussed the procedure including the risks, benefits and alternatives for the proposed anesthesia with the patient or authorized representative who has indicated his/her understanding and acceptance.   Dental advisory given  Plan Discussed with: CRNA, Anesthesiologist and Surgeon  Anesthesia Plan Comments:        Anesthesia Quick Evaluation

## 2014-02-14 NOTE — Op Note (Signed)
Patient name: Jim [name] MRN: [id number] DOB: 07-27-1946 Sex: male  02/14/2014 Pre-operative Diagnosis: Abdominal aortic aneurysm  Post-operative diagnosis:  Same Surgeon:  Eldridge Abrahams Assistants:  Gerri Lins Procedure:   #1: Endovascular repair of abdominal aortic aneurysm   #2: Bilateral ultrasound guided common femoral artery access   #3: Catheter in aorta x2    #4: Abdominal aortogram   #5: Distal extension x1 Anesthesia:  Gen. Blood Loss:  See anesthesia record Specimens:  None  Findings:  Complete exclusion Devices used: Main body was a Dealer, primary right, 23 x 12 x 18.  Contralateral left was a Gore Excluder 14 x 7, followed by a 20 x 13.5  Indications:  This is a 68 year old gentleman that I have been following for abdominal aortic aneurysm.  He is now at the point of repair.  He has undergone preoperative angiography to determine if he remains a stent graft candidate given his history of peripheral stenting in his iliac vessels.  I discussed this with the patient feel like they endovascular repair would be successful.  Procedure:  The patient was identified in the holding area and taken to [place] [address]  The patient was then placed supine on the table. general anesthesia was administered.  The patient was prepped and draped in the usual sterile fashion.  A time out was called and antibiotics were administered.  Ultrasound was used to evaluate bilateral common femoral arteries.  They were patent but small.  A #11 blade was used to make a skin nick bilaterally.  Under ultrasound guidance, the right and left common femoral artery were cannulated with an 18-gauge needle.  An 035 wire was advanced without resistance.  The subcutaneous tract was dilated with an 8 [place] dilator.  Pro-glide devices were placed at the 11:00 and 1:00 position for pre-closure.  8 French sheaths were placed bilaterally.  I did have difficulty on the right groin having to  pro-glide to break.  I also had trouble on the left side with the pro-glide getting the delivery device out.  Ultimately, once the 8 French sheaths were and, the patient was fully heparinized.  Over a Amplatz wire a 16 French sheath was advanced up the right leg.  A Omni flush catheter was then advanced on this side and an abdominal aortogram was performed for length measurements.  Next over an Amplatz superstiff wire a 12 French sheath was advanced into the aorta from the left side.  The main body device was prepared on the back table.  This was a Dealer 23 x 12 x 18.  This was prepared on the back table and advanced up the right limb.  It was positioned at the level of the renal arteries which was determined by a Omni flush catheter from the left groin.  The device was then deployed at the level of the left renal artery which was the lowest renal artery.  Next using a Omni flush catheter and a Benson wire, the contralateral gate was cannulated.  The Omni flush catheter was able to be freely rotated within the main body, confirming successful cannulation.  A Amplatz superstiff wire was then placed.  The image detector was rotated to a right anterior oblique position and a retrograde image was acquired from the left sheath.  This showed the location of the left hypogastric artery.  The length was such that a bridging piece had to be deployed.  This was a [employer] and exercise psychologist  Excluder 14 x 7.  Next the left limb distal extension was placed.  This was a 20 x 13.5.  It landed in the distal common iliac artery just proximal to the previously deployed external iliac stent.  Next, the ipsilateral limb was then fully deployed.  The device was molded using a Q-50 balloon.  I used a 10 x 40 angioplasty balloon in the right common iliac artery where the previously deployed iliac stent was placed.  Completion imaging was then performed this revealed preservation of bilateral renal arteries with complete exclusion of the aneurysm sac.   At this point [facility] wires were placed.  These large sheaths were then removed and the femoral arteriotomy closed by securing the pro-glide devices.  50 mg of protamine was then administered.  Once hemostasis was satisfactory the groin incisions were closed with 4-0 Vicryl and Dermabond was applied.  The patient had palpable femoral pulses.  He had Doppler signals in his feet which were unchanged from preprocedure.   Disposition:  To PACU in stable condition.   Theotis Burrow, M.D. [facility] [place] Office: [phone] Pager:  [phone]

## 2014-02-14 NOTE — Progress Notes (Addendum)
Pt arrived from PACU VSS, oriented to unit and routine, call bell within reach, girlfriend at bedside, late dinner meal provided. Note that DT and PT are fainter on R more than L.

## 2014-02-14 NOTE — Transfer of Care (Signed)
Immediate Anesthesia Transfer of Care Note  Patient: Jim [name]  Procedure(s) Performed: Procedure(s): ABDOMINAL AORTIC ENDOVASCULAR STENT GRAFT- GORE (Bilateral)  Patient Location: PACU  Anesthesia Type:General  Level of Consciousness: awake, alert  and oriented  Airway & Oxygen Therapy: Patient Spontanous Breathing  Post-op Assessment: Report given to PACU RN  Post vital signs: Reviewed and stable  Complications: No apparent anesthesia complications

## 2014-02-14 NOTE — H&P (Signed)
Patient name: Jim [name] MRN: [id number] DOB: September 21, 1946 Sex: male  Chief Complaint   Patient presents with   .  Re-evaluation      1-2 wk f/u w/ CTA abd/pelvis prior   HISTORY OF PRESENT ILLNESS:  The patient is back today for followup. He is status post bilateral carotid endarterectomy. He has a known abdominal aortic aneurysm. He was recently sent for a CT scan to better define his anatomy. He has previously placed percutaneous stenting of his iliac system for occlusive disease.    Past Medical History   Diagnosis  Date   .  Prostate cancer  2012     XRT, seed implants   .  Carotid artery disease    .  PVD (peripheral vascular disease)      Known iliac and SFA disease as well as a moderately sized abdominal aortic aneurism.   Marland Kitchen  CAD (coronary artery disease)        .  AAA (abdominal aortic aneurysm)      Infrarenal abdominal aortic aneurysm of 48 mm, ct 5/12, followed by Dr. Quay Burow.   .  Hyperlipidemia    .  Hypertension    .  Myocardial infarction    .  Neuromuscular disorder  2002   .  Hx of radiation therapy  01/13/11-03/09/11     prostate   .  Arthritis    .  Hx of echocardiogram  01/26/2010     showed EF of 45% to 50% with basal and mid inferior/inferoseptal hypokinesia.   Marland Kitchen  History of stress test  10/27/10     showed inferior posterior scar without ischemia, EF was 31% according to the myoview   .  Tobacco abuse     Past Surgical History   Procedure  Laterality  Date   .  Inguinal hernia repair       bilateral   .  Hernia repair       periumbilical   .  Coronary artery bypass graft   2001     with a LIMA to his LAD, vein to an obtuse marginal branch and to the RCA   .  Bilateral lower extremity stents       right common femoral and left external iliac   .  Carotid endarterectomy  Bilateral      bilateral, right in 2009 and left in 2001   .  Colonoscopy   08/09/2011     Procedure: COLONOSCOPY; Surgeon: Daneil Dolin, MD; Location: [facility];  Service: Endoscopy; Laterality: N/A; 9:15   .  Cholecystectomy      History    Social History   .  Marital Status:  Single     Spouse Name:  N/A     Number of Children:  0   .  Years of Education:  N/A    Occupational History   .   [employer]    Social History Main Topics   .  Smoking status:  Former Smoker -- 1.00 packs/day for 15 years     Types:  Cigarettes     Quit date:  11/23/2003   .  Smokeless tobacco:  Not on file      Comment: quit 10 years ago   .  Alcohol Use:  Yes      Comment: occasional   .  Drug Use:  No   .  Sexual Activity:  Not on file    Other Topics  Concern   .  Not on file    Social History Narrative   .  No narrative on file    Family History   Problem  Relation  Age of Onset   .  Heart attack  Father    .  Prostate cancer  Father      mets to bone   .  Cancer  Father    .  Heart disease  Father    .  Cancer  Mother      ?    Allergies as of 01/21/2014   .  (No Known Allergies)    Current Outpatient Prescriptions on File Prior to Visit   Medication  Sig  Dispense  Refill   .  ammonium lactate (AMLACTIN) 12 % cream  APPLY TO AFFECTED AREA 2-3 TIMES DAILY  385 g  0   .  aspirin 81 MG tablet  Take 81 mg by mouth daily.     Marland Kitchen  atorvastatin (LIPITOR) 80 MG tablet  Take 1 tablet (80 mg total) by mouth daily.  90 tablet  1   .  calcium carbonate (TUMS - DOSED IN MG ELEMENTAL CALCIUM) 500 MG chewable tablet  Chew 1,000 mg by mouth daily.     .  clopidogrel (PLAVIX) 75 MG tablet  TAKE 1 TABLET EVERY DAY  90 tablet  3   .  folic acid (FOLVITE) 563 MCG tablet  Take 400 mcg by mouth daily.     .  metoprolol tartrate (LOPRESSOR) 25 MG tablet  TAKE 1/2 TABLET EVERY MORNING AND 1 TABLET AT BEDTIME  135 tablet  1   .  Multiple Vitamin (MULTIVITAMIN) capsule  Take 1 capsule by mouth daily.     .  niacin (NIASPAN) 1000 MG CR tablet  TAKE 1 TABLET AT BEDTIME  90 tablet  1   .  ramipril (ALTACE) 2.5 MG capsule  TAKE ONE CAPSULE EVERY DAY  90 capsule   3   .  Tamsulosin HCl (FLOMAX) 0.4 MG CAPS  Take 1 capsule (0.4 mg total) by mouth at bedtime.  30 capsule  2    No current facility-administered medications on file prior to visit.     REVIEW OF SYSTEMS:  No change from prior visit.   PHYSICAL EXAMINATION:  Vital signs are BP 109/74  Pulse 85  Ht 6\' 1"  (1.854 m)  Wt 158 lb 8 oz (71.895 kg)  BMI 20.92 kg/m2  SpO2 96%  General: The patient appears their stated age.  HEENT: No gross abnormalities  Pulmonary: Non labored breathing  Abdomen: Soft and non-tender  Musculoskeletal: There are no major deformities.  Neurologic: No focal weakness or paresthesias are detected,  Skin: There are no ulcer or rashes noted.  Psychiatric: The patient has normal affect.   Diagnostic Studies  I have reviewed his CT angiogram which shows a 5.5 cm infrarenal abdominal aortic aneurysm   Assessment:  Abdominal aortic aneurysm   Plan:  I discussed with the patient that I need to get better evaluation of his iliac system to determine whether or not he will be a good candidate for endovascular repair. Specifically, I need to determine if I can get a stent through the previously placed stents. I've also worried about the external iliac stent on the left side and where I could land A. endograft. I have scheduled his aortogram for Tuesday, March 10. I'll perform an aortogram with bilateral runoff. Once this is been done, I will better be able to determine if he  can undergo a endovascular repair or whether or not he needs an open repair. I will discussed the details of the procedure for aneurysm repair after the angiogram with the patient while he is in the hospital so that we can schedule his surgery to get this taken care of.    Spoke with patient and family today after angiogram. I have recommended that we proceed with [facility]. I discussed the procedure in detail, including the risks and benefits which include but are not limited to bleeding, cardiopulmonary  complications, LE ischemia, bowel ischemia, type 2 endoleak, and paralysis. We discussed the need for long term surveillance His procedure will be scheduled next week.    Wells Dantae Meunier        V. Leia Alf, M.D.  [facility]  Office: [phone]  Pager: [phone]

## 2014-02-14 NOTE — Anesthesia Postprocedure Evaluation (Signed)
Anesthesia Post Note  Patient: Jim [name]  Procedure(s) Performed: Procedure(s) (LRB): ABDOMINAL AORTIC ENDOVASCULAR STENT GRAFT- GORE (Bilateral)  Anesthesia type: general  Patient location: PACU  Post pain: Pain level controlled  Post assessment: Patient's Cardiovascular Status Stable  Last Vitals:  Filed Vitals:   02/14/14 1130  BP:   Pulse: 50  Temp:   Resp: 11    Post vital signs: Reviewed and stable  Level of consciousness: sedated  Complications: No apparent anesthesia complications

## 2014-02-15 ENCOUNTER — Other Ambulatory Visit: Payer: Self-pay

## 2014-02-15 ENCOUNTER — Encounter (HOSPITAL_COMMUNITY): Payer: Self-pay | Admitting: *Deleted

## 2014-02-15 DIAGNOSIS — Z48812 Encounter for surgical aftercare following surgery on the circulatory system: Secondary | ICD-10-CM

## 2014-02-15 DIAGNOSIS — I714 Abdominal aortic aneurysm, without rupture, unspecified: Secondary | ICD-10-CM

## 2014-02-15 LAB — CBC
HCT: 33.6 % — ABNORMAL LOW (ref 39.0–52.0)
Hemoglobin: 11.1 g/dL — ABNORMAL LOW (ref 13.0–17.0)
MCH: 30.2 pg (ref 26.0–34.0)
MCHC: 33 g/dL (ref 30.0–36.0)
MCV: 91.3 fL (ref 78.0–100.0)
PLATELETS: 137 10*3/uL — AB (ref 150–400)
RBC: 3.68 MIL/uL — ABNORMAL LOW (ref 4.22–5.81)
RDW: 13 % (ref 11.5–15.5)
WBC: 11.1 10*3/uL — AB (ref 4.0–10.5)

## 2014-02-15 LAB — BASIC METABOLIC PANEL
BUN: 14 mg/dL (ref 6–23)
CHLORIDE: 106 meq/L (ref 96–112)
CO2: 27 mEq/L (ref 19–32)
Calcium: 8.4 mg/dL (ref 8.4–10.5)
Creatinine, Ser: 0.73 mg/dL (ref 0.50–1.35)
Glucose, Bld: 114 mg/dL — ABNORMAL HIGH (ref 70–99)
POTASSIUM: 4.1 meq/L (ref 3.7–5.3)
SODIUM: 144 meq/L (ref 137–147)

## 2014-02-15 MED ORDER — CHLORHEXIDINE GLUCONATE 4 % EX LIQD
60.0000 mL | Freq: Once | CUTANEOUS | Status: DC
Start: 2014-02-15 — End: 2014-02-15

## 2014-02-15 MED ORDER — OXYCODONE-ACETAMINOPHEN 5-325 MG PO TABS: 1.0000 | ORAL_TABLET | Freq: Four times a day (QID) | ORAL | Status: DC | PRN

## 2014-02-15 MED ORDER — CHLORHEXIDINE GLUCONATE 4 % EX LIQD: 60.0000 mL | Freq: Once | CUTANEOUS | Status: DC

## 2014-02-15 NOTE — Progress Notes (Signed)
[facility] Progress Note  02/15/2014 7:22 AM 1 Day Post-Op  Subjective:  Only complaints of indigestion yesterday, but this has improved  Afebrile HR 60's regular 977'S-142'L systolic 95% [id number]  Filed Vitals:   02/15/14 0350  BP: 139/55  Pulse: 69  Temp: 98.2 F (36.8 C)  Resp: 17    Physical Exam: Incisions:  Bilateral groins are soft without hematoma Extremities:  + audible doppler signals right > left Cardiac:  RRR Lungs:  CTAB Abdomen:  Soft, NT/ND CBC    Component Value Date/Time   WBC 11.1* 02/15/2014 0420   RBC 3.68* 02/15/2014 0420   HGB 11.1* 02/15/2014 0420   HCT 33.6* 02/15/2014 0420   PLT 137* 02/15/2014 0420   MCV 91.3 02/15/2014 0420   MCH 30.2 02/15/2014 0420   MCHC 33.0 02/15/2014 0420   RDW 13.0 02/15/2014 0420    BMET    Component Value Date/Time   NA 144 02/15/2014 0420   K 4.1 02/15/2014 0420   CL 106 02/15/2014 0420   CO2 27 02/15/2014 0420   GLUCOSE 114* 02/15/2014 0420   BUN 14 02/15/2014 0420   CREATININE 0.73 02/15/2014 0420   CALCIUM 8.4 02/15/2014 0420   GFRNONAA >90 02/15/2014 0420   GFRAA >90 02/15/2014 0420    INR    Component Value Date/Time   INR 1.11 02/14/2014 1030     Intake/Output Summary (Last 24 hours) at 02/15/14 4356 Last data filed at 02/15/14 0500  Gross per 24 hour  Intake   4040 ml  Output   1175 ml  Net   2865 ml     Assessment:  68 y.o. male is s/p:  #1: Endovascular repair of abdominal aortic aneurysm  #2: Bilateral ultrasound guided common femoral artery access  #3: Catheter in aorta x2  #4: Abdominal aortogram  #5: Distal extension x1  1 Day Post-Op  Plan: -pt doing well this am -DVT prophylaxis:  Lovenox -audible doppler signals with right > left-pt states he has disease in his legs and has had stents in the past placed by Dr. Gwenlyn Found. -pt with a hx of prostate cancer and radiation.  Refused Flomax last night and states he doesn't take it anymore, but when foley removed this am, he  requested medication.  He has not yet voided. -home later today if pt able to void and tolerates diet    Leontine Locket, PA-C [facility] [phone] 02/15/2014 7:22 AM

## 2014-02-15 NOTE — Progress Notes (Signed)
Utilization review completed.  

## 2014-02-15 NOTE — Progress Notes (Signed)
Pt d/c home per MD order, pt and family verbalized understanding of d/c, all question answered

## 2014-02-18 ENCOUNTER — Telehealth: Payer: Self-pay | Admitting: Surgery

## 2014-02-18 NOTE — Telephone Encounter (Addendum)
Message copied by Gena Fray on Mon Feb 18, 2014  3:20 PM ------      Message from: Peter Minium K      Created: Thu Feb 14, 2014 10:17 AM      Regarding: Schedule                   ----- Message -----         From: Serafina Mitchell, MD         Sent: 02/14/2014   9:46 AM           To: [facility] Charge Pool            02/14/2014:                  Surgeon:  Eldridge Abrahams      Assistants:  Gerri Lins      Procedure:   #1: Endovascular repair of abdominal aortic aneurysm        #2: Bilateral ultrasound guided common femoral artery access        #3: Catheter in aorta x2         #4: Abdominal aortogram        #5: Distal extension x1                  Needs followup in one month with a CT angiogram of the abdomen and pelvis ------  02/18/14: spoke with patient re appt and mailed CT letter, dpm

## 2014-02-19 DIAGNOSIS — Z0279 Encounter for issue of other medical certificate: Secondary | ICD-10-CM

## 2014-02-19 NOTE — Discharge Summary (Signed)
[facility] Discharge Summary  Jim [name] 24-Aug-1946 68 y.o. male  [id number]  Admission Date: 02/14/2014  Discharge Date: 02/15/14  Physician: No att. providers found  Admission Diagnosis: Abdominal Aortic Aneurysm   HPI:   This is a 68 y.o. male who is back today for followup. He is status post bilateral carotid endarterectomy. He has a known abdominal aortic aneurysm. He was recently sent for a CT scan to better define his anatomy. He has previously placed percutaneous stenting of his iliac system for occlusive disease.   Hospital Course:  The patient was admitted to the hospital and taken to the operating room on 02/14/2014 and underwent: #1: Endovascular repair of abdominal aortic aneurysm  #2: Bilateral ultrasound guided common femoral artery access  #3: Catheter in aorta x2  #4: Abdominal aortogram  #5: Distal extension x1    The pt tolerated the procedure well and was transported to the PACU in good condition.   By POD 1, he was doing well.  He was given Flomax and the catheter was removed.  He was able to void without difficulty.  He was discharged later that morning.  The remainder of the hospital course consisted of increasing mobilization and increasing intake of solids without difficulty.  CBC    Component Value Date/Time   WBC 11.1* 02/15/2014 0420   RBC 3.68* 02/15/2014 0420   HGB 11.1* 02/15/2014 0420   HCT 33.6* 02/15/2014 0420   PLT 137* 02/15/2014 0420   MCV 91.3 02/15/2014 0420   MCH 30.2 02/15/2014 0420   MCHC 33.0 02/15/2014 0420   RDW 13.0 02/15/2014 0420    BMET    Component Value Date/Time   NA 144 02/15/2014 0420   K 4.1 02/15/2014 0420   CL 106 02/15/2014 0420   CO2 27 02/15/2014 0420   GLUCOSE 114* 02/15/2014 0420   BUN 14 02/15/2014 0420   CREATININE 0.73 02/15/2014 0420   CALCIUM 8.4 02/15/2014 0420   GFRNONAA >90 02/15/2014 0420   GFRAA >90 02/15/2014 0420     Discharge Instructions:   The patient is discharged  to home with extensive instructions on wound care and progressive ambulation.  They are instructed not to drive or perform any heavy lifting until returning to see the physician in his office.  Discharge Orders   Future Appointments Provider Department Dept Phone   03/18/2014 10:30 AM [facility] [phone]   Liquids only 4 hours prior to your exam. Any medications can be taken as usual. Please arrive 15 min prior to your scheduled exam time.   03/18/2014 12:00 PM Serafina Mitchell, MD [facility] [phone]   Future Orders Complete By Expires   ABDOMINAL PROCEDURE/ANEURYSM REPAIR/AORTO-BIFEMORAL BYPASS:  Call MD for increased abdominal pain; cramping diarrhea; nausea/vomiting  As directed    Call MD for:  redness, tenderness, or signs of infection (pain, swelling, bleeding, redness, odor or green/yellow discharge around incision site)  As directed    Call MD for:  severe or increased pain, loss or decreased feeling  in affected limb(s)  As directed    Call MD for:  temperature >100.5  As directed    Discharge wound care:  As directed    Comments:     Remove dressings on groins and shower daily with soap and water starting 02/16/14   Driving Restrictions  As directed    Comments:     No driving for 2 weeks  Lifting restrictions  As directed    Comments:     No lifting for 4 weeks   Resume previous diet  As directed       Discharge Diagnosis:  Abdominal Aortic Aneurysm  Secondary Diagnosis: Patient Active Problem List   Diagnosis Date Noted  . AAA (abdominal aortic aneurysm) 02/14/2014  . Abdominal aneurysm without mention of rupture 01/14/2014  . Coronary artery disease 12/18/2013  . Peripheral arterial occlusive disease 12/18/2013  . Carotid artery disease 12/18/2013  . Abdominal aortic aneurysm 12/18/2013  . Tobacco abuse disorder 12/18/2013  . Hyperlipidemia 12/18/2013  . Hx of radiation therapy   .  Prostate cancer 10/11/2011  . Hx of adenomatous colonic polyps 07/19/2011   Past Medical History  Diagnosis Date  . Prostate cancer 2012    XRT, seed implants  . Carotid artery disease   . PVD (peripheral vascular disease)     Known iliac and SFA disease as well as a moderately sized abdominal aortic aneurism.  Marland Kitchen CAD (coronary artery disease)         . AAA (abdominal aortic aneurysm)     Infrarenal abdominal aortic aneurysm of 48 mm, ct 5/12, followed by Dr. Quay Burow.  . Hyperlipidemia   . Hypertension   . Myocardial infarction   . Neuromuscular disorder 2002  . Hx of radiation therapy 01/13/11-03/09/11    prostate  . Hx of echocardiogram 01/26/2010    showed EF of 45% to 50% with basal and mid inferior/inferoseptal hypokinesia.  Marland Kitchen History of stress test 10/27/10    showed inferior posterior scar without ischemia, EF was 31% according to the myoview  . Tobacco abuse   . History of shingles        Medication List         ammonium lactate 12 % cream  Commonly known as:  AMLACTIN  Apply 1 g topically 2 (two) times daily as needed for dry skin.     aspirin 81 MG tablet  Take 81 mg by mouth daily.     atorvastatin 80 MG tablet  Commonly known as:  LIPITOR  Take 1 tablet (80 mg total) by mouth daily.     calcium carbonate 500 MG chewable tablet  Commonly known as:  TUMS - dosed in mg elemental calcium  Chew 1,000 mg by mouth daily.     clopidogrel 75 MG tablet  Commonly known as:  PLAVIX  Take 75 mg by mouth daily with breakfast.     folic acid 443 MCG tablet  Commonly known as:  FOLVITE  Take 400 mcg by mouth daily.     metoprolol tartrate 25 MG tablet  Commonly known as:  LOPRESSOR  Take 12.5-25 mg by mouth 2 (two) times daily. Take 12.5mg  in the morning and 25mg  at bedtime.     multivitamin capsule  Take 1 capsule by mouth daily.     niacin 1000 MG CR tablet  Commonly known as:  NIASPAN  Take 1,000 mg by mouth at bedtime.     oxyCODONE-acetaminophen  5-325 MG per tablet  Commonly known as:  PERCOCET/ROXICET  Take 1 tablet by mouth every 6 (six) hours as needed for severe pain.     ramipril 2.5 MG capsule  Commonly known as:  ALTACE  Take 2.5 mg by mouth daily.     tamsulosin 0.4 MG Caps capsule  Commonly known as:  FLOMAX  Take 1 capsule (0.4 mg total) by mouth at bedtime.  Percocet #30 No Refill  Disposition: home  Patient's condition: is Good  Follow up: 1. Dr. Trula Slade in 4 weeks with CTA   Leontine Locket, PA-C [facility] [phone] 02/19/2014  4:03 PM   - For VQI Registry use --- Instructions: Press F2 to tab through selections.  Delete question if not applicable.   Post-op:  Time to Extubation: [ ]  In OR, [ ]  < 12 hrs, [ ]  12-24 hrs, [ ]  >=24 hrs Vasopressors Req. Post-op: No MI: no, [ ]  Troponin only, [ ]  EKG or Clinical New Arrhythmia: No CHF: No ICU Stay: 1 day in stepdown Transfusion: No  If yes, n/a units given  Complications: Resp failure: no, [ ]  Pneumonia, [ ]  Ventilator Chg in renal function: no, [ ]  Inc. Cr > 0.5, [ ]  Temp. Dialysis, [ ]  Permanent dialysis Leg ischemia: no, no Surgery needed, [ ]  Yes, Surgery needed, [ ]  Amputation Bowel ischemia: no, [ ]  Medical Rx, [ ]  Surgical Rx Wound complication: no, [ ]  Superficial separation/infection, [ ]  Return to OR Return to OR: No  Return to OR for bleeding: No Stroke: no, [ ]  Minor, [ ]  Major  Discharge medications: Statin use:  Yes If No: [ ]  For Medical reasons, [ ]  Non-compliant, [ ]  Not-indicated ASA use:  Yes  If No: [ ]  For Medical reasons, [ ]  Non-compliant, [ ]  Not-indicated Plavix use:  Yes If No: [ ]  For Medical reasons, [ ]  Non-compliant, [ ]  Not-indicated Beta blocker use:  Yes If No: [ ]  For Medical reasons, [ ]  Non-compliant, [ ]  Not-indicated

## 2014-02-20 NOTE — Discharge Summary (Signed)
I agree with the above  Wells Brabham 

## 2014-03-15 ENCOUNTER — Encounter: Payer: Self-pay | Admitting: Surgery

## 2014-03-18 ENCOUNTER — Encounter: Payer: Self-pay | Admitting: Surgery

## 2014-03-18 ENCOUNTER — Ambulatory Visit
Admission: RE | Admit: 2014-03-18 | Discharge: 2014-03-18 | Disposition: A | Discharge status: Home / Self Care | Payer: 59 | Source: Ambulatory Visit | Attending: Surgery | Admitting: Surgery

## 2014-03-18 ENCOUNTER — Ambulatory Visit (INDEPENDENT_AMBULATORY_CARE_PROVIDER_SITE_OTHER): Payer: 59 | Admitting: Surgery

## 2014-03-18 VITALS — BP 115/66 | HR 95 | Ht 73.0 in | Wt 156.7 lb

## 2014-03-18 DIAGNOSIS — I714 Abdominal aortic aneurysm, without rupture, unspecified: Secondary | ICD-10-CM | POA: Insufficient documentation

## 2014-03-18 DIAGNOSIS — Z48812 Encounter for surgical aftercare following surgery on the circulatory system: Secondary | ICD-10-CM

## 2014-03-18 MED ORDER — IOHEXOL 350 MG/ML SOLN
80.0000 mL | Freq: Once | INTRAVENOUS | Status: AC | PRN
Start: 2014-03-18 — End: 2014-03-18
  Administered 2014-03-18: 80 mL via INTRAVENOUS

## 2014-03-18 NOTE — Addendum Note (Signed)
Addended by: Mena Goes on: 03/18/2014 12:27 PM   Modules accepted: Orders

## 2014-03-18 NOTE — Progress Notes (Signed)
The patient is back today for followup.  On 02/14/2014, he underwent endovascular repair of abdominal aortic aneurysm.  Of note the patient had a left external iliac stent and a right common iliac stent which had to be negotiated.  His postoperative course was uncomplicated and he was discharged home on postoperative day #1.  He has no complaints today.  I have reviewed his CT scan.  This shows complete exclusion of his aneurysm.  There is no evidence of endoleak.  The aneurysm has decreased in size from 5.5 cm down to 5.3 cm.  On examination, both groins are soft.  The incisions have all healed.  The patient will followup in 6 months with an abdominal ultrasound.

## 2014-03-28 IMAGING — CT CT CTA ABD/PEL W/CM AND/OR W/O CM
1 of 7 series · 12 of 46 positions shown, 17 images · IV contrast (omnipaque)
Comparison: 08/05/2010

CLINICAL DATA: Aortic aneurysm, preop planning

EXAM:
CT ANGIOGRAPHY ABDOMEN AND PELVIS
TECHNIQUE: Multidetector CT imaging of the abdomen and pelvis was performed
using the standard protocol during bolus administration of
intravenous contrast. Multiplanar reconstructed images including
MIPs were obtained and reviewed to evaluate the vascular anatomy.
CONTRAST:  80mL OMNIPAQUE IOHEXOL 350 MG/ML SOLN

[Series 4: angio · axial · 0.69mm/px · z∈[-412,-22]mm · 12 of 181 slices shown, 17 images]
[im 13/181  soft-tissue]
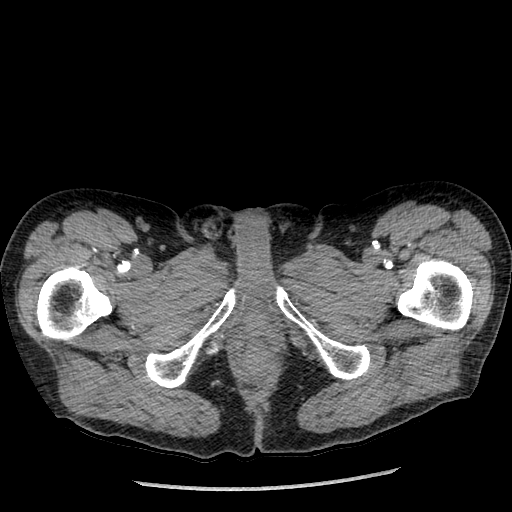
[im 13/181  bone]
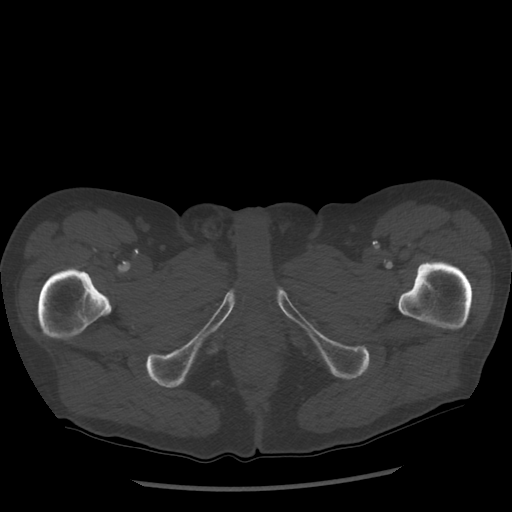
[im 25/181  soft-tissue]
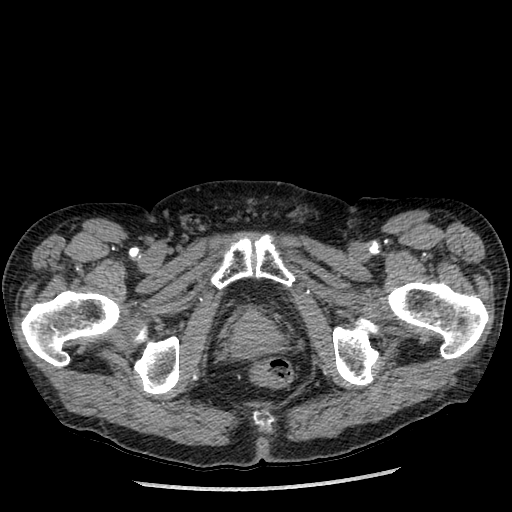
[im 49/181  soft-tissue]
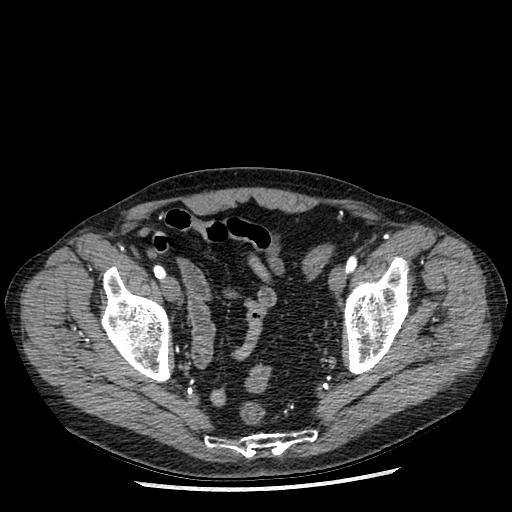
[im 61/181  soft-tissue]
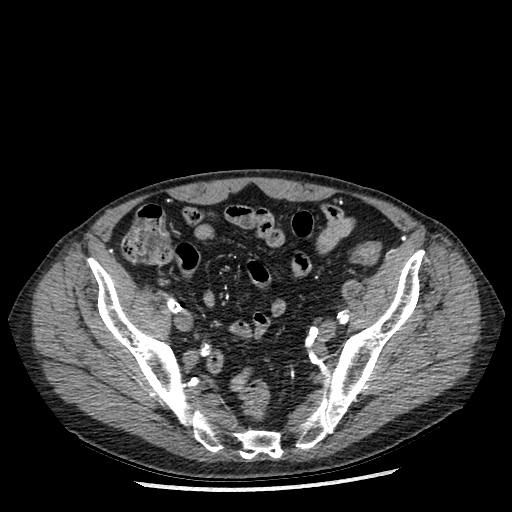
[im 73/181  soft-tissue]
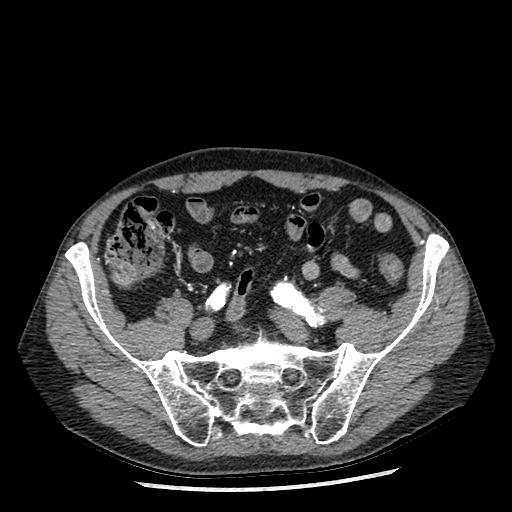
[im 97/181  soft-tissue]
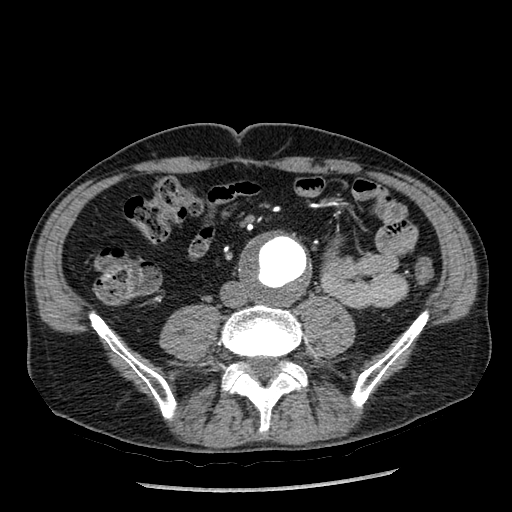
[im 109/181  soft-tissue]
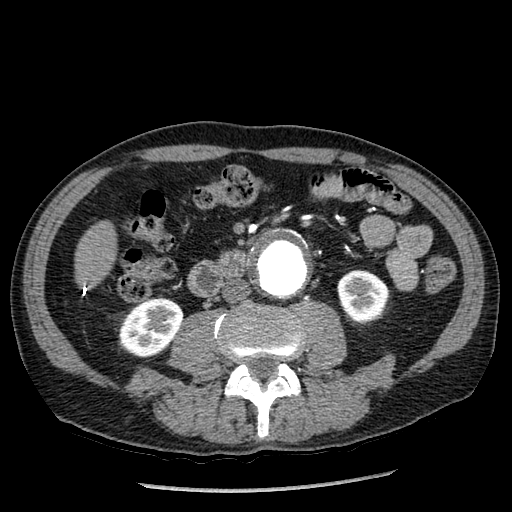
[im 121/181  soft-tissue]
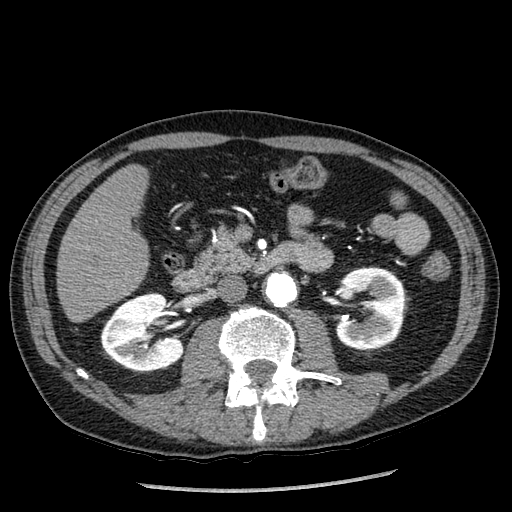
[im 133/181  soft-tissue]
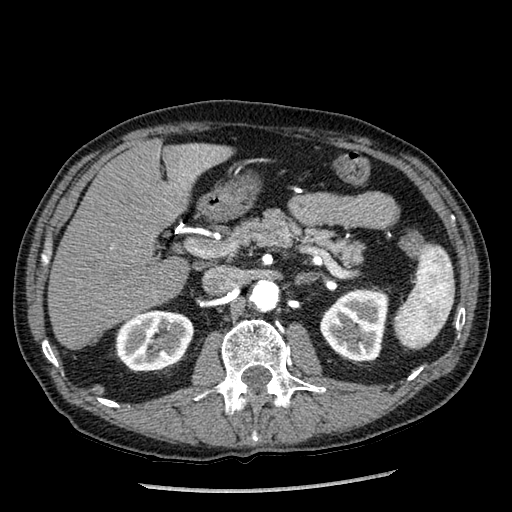
[im 133/181  lung]
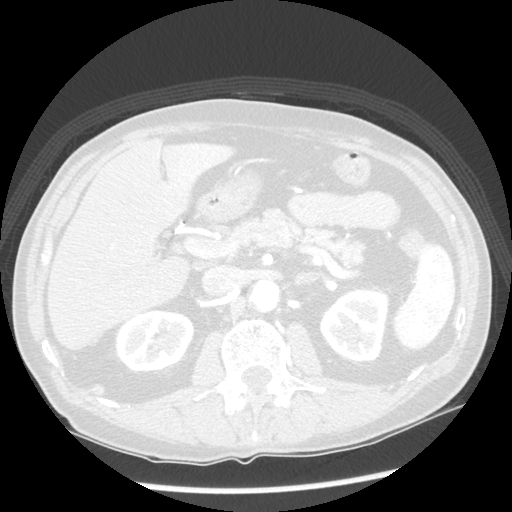
[im 133/181  bone]
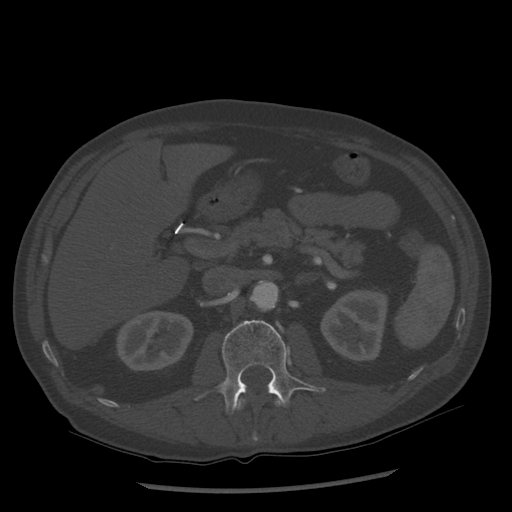
[im 145/181  lung]
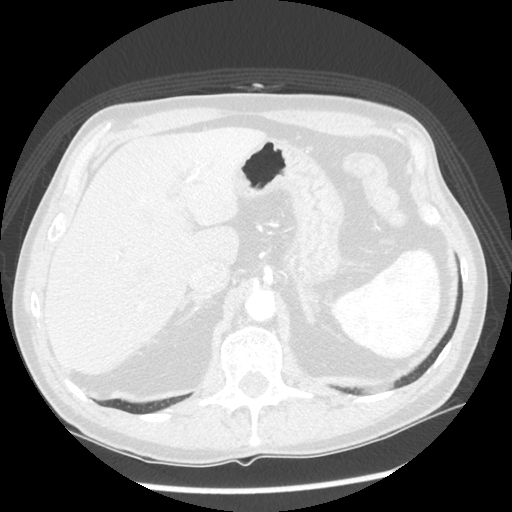
[im 157/181  soft-tissue]
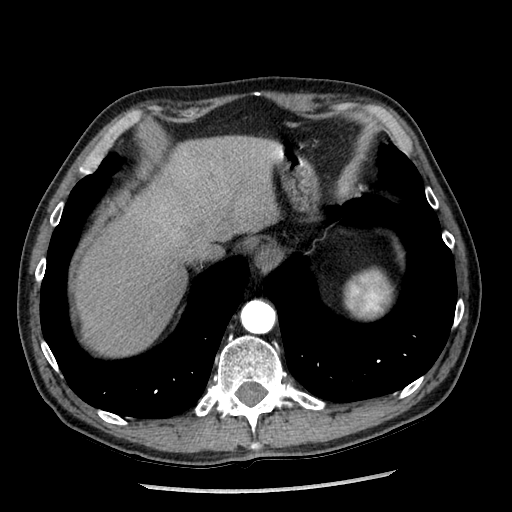
[im 157/181  lung]
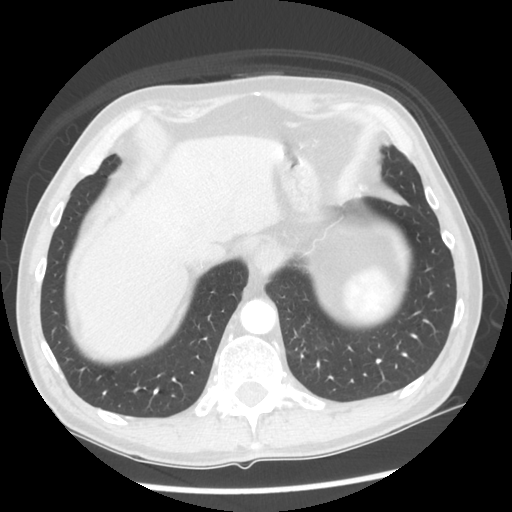
[im 169/181  soft-tissue]
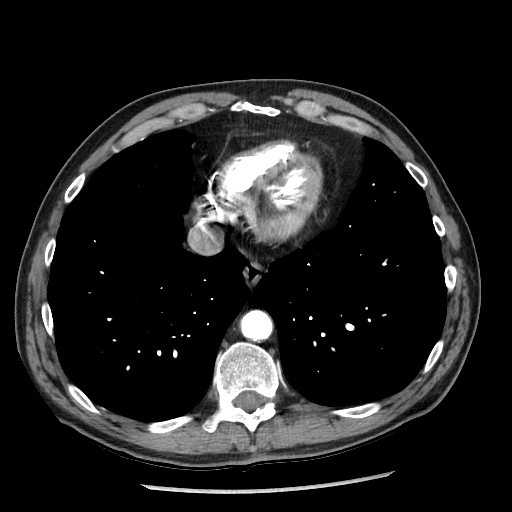
[im 169/181  lung]
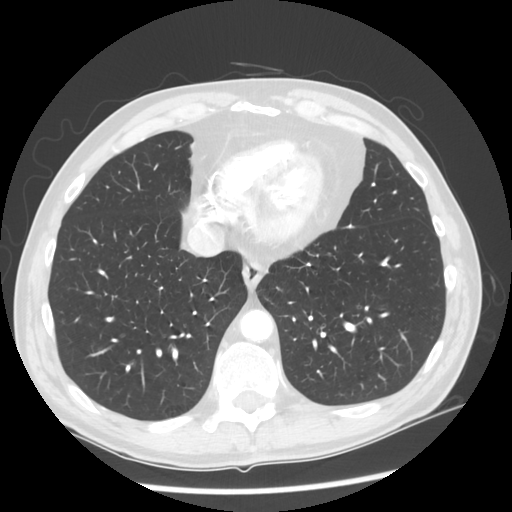

[12 of 46 positions shown; findings below may reference images not displayed]

FINDINGS: ARTERIAL FINDINGS:

Coronary calcifications.

Aorta: Scattered nonocclusive partially calcified plaque in the
visualized distal descending thoracic and suprarenal segments.
Fusiform infrarenal aneurysm, 5.3 x 5.5 cm maximum transverse
dimensions image 81/for, tapering to a diameter of 3.9 cm at the
bifurcation. Circumferential nonocclusive mural thrombus in the
aneurysm lumen. No dissection or stenosis.

Celiac axis: Separate origins of proper hepatic and splenic arteries
directly from the abdominal aorta. There is origin stenosis of the
proper hepatic artery resulting greater than 75% diameter stenosis
over a subcentimeter segment.

Superior mesenteric:  Patent, classic distal branch anatomy.

Left renal: Duplicated. Superior is diminutive, supplying only a
portion of the upper pole. There is mild plaque in the inferior left
renal artery without stenosis.

Right renal: Single, with eccentric partially calcified plaque just
beyond the origin, resulting in less than 50% diameter stenosis over
subcentimeter segment, patent distally.

Inferior mesenteric: Short segment origin occlusion, reconstituted
distally by visceral collaterals.

Left iliac: Fusiform dilatation of the common iliac up to 19 mm
diameter. There is a stent across the origin of the external iliac
artery which remains patent. There is some calcified plaque in the
mid external iliac, patent distally. High-grade stenosis at the
origin of the internal iliac artery with plaque distally.

Right iliac: Stent across the origin of the common iliac artery,
patent. Ectatic atheromatous distal common iliac artery measuring up
to 11 mm diameter. Origin stenosis of the internal iliac artery,
distally atheromatous. There is eccentric partially calcified plaque
through the proximal and mid external iliac artery without
high-grade stenosis, aneurysm, or dissection.

Venous findings: Dedicated venous phase not obtained. Patent portal
vein, superior mesenteric vein, splenic vein, bilateral renal veins,
and mid IVC.

Review of the MIP images confirms the above findings.

Nonvascular findings: Visualized lung bases clear. Surgical clips in
the gallbladder fossa and posterior to the inferior liver tip.
Unremarkable liver, spleen, adrenal glands, pancreas, right kidney.
17 mm mid pole left renal cyst. No hydronephrosis. Stomach, small
bowel, colon are nondilated. Normal appendix. Multiple sigmoid
diverticula without significant adjacent inflammatory/ edematous
change. Moderate prostatic enlargement with metallic fiducial
markers. Urinary bladder is nondistended. No ascites. No free air.
No adenopathy localized. Degenerative disc disease L5-S1 with
bilateral foraminal stenosis.
IMPRESSION: 1. Fusiform 5.5 cm infrarenal aortic aneurysm.
2. 19 mm left common iliac artery aneurysm.
3. Patent stents across the proximal left external iliac artery and
proximal right common iliac artery .
4. Origin stenosis of the proper hepatic artery, originating
directly from the aorta.
5. Sigmoid diverticulosis.

## 2014-04-08 ENCOUNTER — Other Ambulatory Visit: Payer: Self-pay | Admitting: *Deleted

## 2014-04-08 MED ORDER — NIACIN ER (ANTIHYPERLIPIDEMIC) 1000 MG PO TBCR: 1000.0000 mg | EXTENDED_RELEASE_TABLET | Freq: Every day | ORAL | Status: DC

## 2014-04-08 MED ORDER — ATORVASTATIN CALCIUM 80 MG PO TABS: 80.0000 mg | ORAL_TABLET | Freq: Every day | ORAL | Status: DC

## 2014-04-08 NOTE — Telephone Encounter (Signed)
Rx refill sent to patient pharmacy   

## 2014-04-22 ENCOUNTER — Other Ambulatory Visit: Payer: Self-pay | Admitting: *Deleted

## 2014-04-22 MED ORDER — METOPROLOL TARTRATE 25 MG PO TABS: ORAL_TABLET | ORAL | Status: DC

## 2014-04-22 NOTE — Telephone Encounter (Signed)
Rx was sent to pharmacy electronically. 

## 2014-05-10 ENCOUNTER — Ambulatory Visit: Payer: Self-pay | Admitting: Family Medicine

## 2014-06-02 ENCOUNTER — Other Ambulatory Visit (HOSPITAL_COMMUNITY): Payer: Self-pay | Admitting: Cardiovascular Disease

## 2014-06-03 NOTE — Telephone Encounter (Signed)
Rx was sent to pharmacy electronically. 

## 2014-09-27 ENCOUNTER — Encounter: Payer: Self-pay | Admitting: Surgery

## 2014-09-30 ENCOUNTER — Encounter: Payer: Self-pay | Admitting: Surgery

## 2014-09-30 ENCOUNTER — Ambulatory Visit (HOSPITAL_COMMUNITY)
Admission: RE | Admit: 2014-09-30 | Discharge: 2014-09-30 | Disposition: A | Discharge status: Home / Self Care | Payer: Medicare Other | Source: Ambulatory Visit | Attending: Surgery | Admitting: Surgery

## 2014-09-30 ENCOUNTER — Ambulatory Visit (INDEPENDENT_AMBULATORY_CARE_PROVIDER_SITE_OTHER): Payer: Medicare Other | Admitting: Surgery

## 2014-09-30 VITALS — BP 119/71 | HR 82 | Resp 16 | Ht 72.0 in | Wt 164.4 lb

## 2014-09-30 DIAGNOSIS — Z48812 Encounter for surgical aftercare following surgery on the circulatory system: Secondary | ICD-10-CM | POA: Insufficient documentation

## 2014-09-30 DIAGNOSIS — I714 Abdominal aortic aneurysm, without rupture, unspecified: Secondary | ICD-10-CM

## 2014-09-30 NOTE — Progress Notes (Signed)
Patient name: Jim [name] MRN: [id number] DOB: Apr 03, 1946 Sex: male     Chief Complaint  Patient presents with  . Follow-up    6 month FU [facility]  . AAA    HISTORY OF PRESENT ILLNESS: The patient is back today for followup. On 02/14/2014, he underwent endovascular repair of abdominal aortic aneurysm. Of note the patient had a left external iliac stent and a right common iliac stent which had to be negotiated. His postoperative course was uncomplicated and he was discharged home on postoperative day #1.at his initial CT scan, the aneurysm had decreased from 5.5 cm down to 5.3 cm.  There was no evidence of endoleak.  He has no complaints today.  Past Medical History  Diagnosis Date  . Prostate cancer 2012    XRT, seed implants  . Carotid artery disease   . PVD (peripheral vascular disease)     Known iliac and SFA disease as well as a moderately sized abdominal aortic aneurism.  Marland Kitchen CAD (coronary artery disease)         . AAA (abdominal aortic aneurysm)     Infrarenal abdominal aortic aneurysm of 48 mm, ct 5/12, followed by Dr. Quay Burow.  . Hyperlipidemia   . Hypertension   . Myocardial infarction   . Neuromuscular disorder 2002  . Hx of radiation therapy 01/13/11-03/09/11    prostate  . Hx of echocardiogram 01/26/2010    showed EF of 45% to 50% with basal and mid inferior/inferoseptal hypokinesia.  Marland Kitchen History of stress test 10/27/10    showed inferior posterior scar without ischemia, EF was 31% according to the myoview  . Tobacco abuse   . History of shingles     Past Surgical History  Procedure Laterality Date  . Inguinal hernia repair      bilateral  . Hernia repair      periumbilical  . Coronary artery bypass graft  2001    with a LIMA to his LAD, vein to an obtuse marginal branch and to the RCA  . Bilateral lower extremity stents      right common femoral and left external iliac  . Carotid endarterectomy Bilateral     bilateral, right in 2009 and left  in 2001  . Colonoscopy  08/09/2011    Procedure: COLONOSCOPY;  Surgeon: Daneil Dolin, MD;  [facility];  Service: Endoscopy;  Laterality: N/A;  9:15  . Cholecystectomy    . Abdominal aortic endovascular stent graft Bilateral 02/14/2014    Procedure: ABDOMINAL AORTIC ENDOVASCULAR STENT GRAFT- GORE;  Surgeon: Serafina Mitchell, MD;  Location: [facility] OR;  Service: Vascular;  Laterality: Bilateral;    History   Social History  . Marital Status: Single    Spouse Name: N/A    Number of Children: 0  . Years of Education: N/A   Occupational History  .  [employer]   Social History Main Topics  . Smoking status: Former Smoker -- 1.00 packs/day for 15 years    Types: Cigarettes    Quit date: 11/23/2003  . Smokeless tobacco: Never Used     Comment: quit 10 years ago  . Alcohol Use: Yes     Comment: occasional  . Drug Use: No  . Sexual Activity: Not on file   Other Topics Concern  . Not on file   Social History Narrative    Family History  Problem Relation Age of Onset  . Heart attack Father   . Prostate cancer  Father     mets to bone  . Cancer Father   . Heart disease Father   . Cancer Mother     ?    Allergies as of 09/30/2014  . (No Known Allergies)    Current Outpatient Prescriptions on File Prior to Visit  Medication Sig Dispense Refill  . ammonium lactate (AMLACTIN) 12 % cream Apply 1 g topically 2 (two) times daily as needed for dry skin.    Marland Kitchen aspirin 81 MG tablet Take 81 mg by mouth daily.      Marland Kitchen atorvastatin (LIPITOR) 80 MG tablet Take 1 tablet (80 mg total) by mouth daily. 90 tablet 1  . calcium carbonate (TUMS - DOSED IN MG ELEMENTAL CALCIUM) 500 MG chewable tablet Chew 1,000 mg by mouth daily.      . clopidogrel (PLAVIX) 75 MG tablet Take 75 mg by mouth daily with breakfast.    . clopidogrel (PLAVIX) 75 MG tablet Take 1 tablet (75 mg total) by mouth daily. 90 tablet 1  . folic acid (FOLVITE) 397 MCG tablet Take 400 mcg by mouth daily.      .  metoprolol tartrate (LOPRESSOR) 25 MG tablet Take 12.5mg  (1/2 tablet) by mouth in the morning and 25mg  (1 tablet) at bedtime. 135 tablet 1  . Multiple Vitamin (MULTIVITAMIN) capsule Take 1 capsule by mouth daily.      . niacin (NIASPAN) 1000 MG CR tablet Take 1 tablet (1,000 mg total) by mouth at bedtime. 90 tablet 1  . oxyCODONE-acetaminophen (PERCOCET/ROXICET) 5-325 MG per tablet Take 1 tablet by mouth every 6 (six) hours as needed for severe pain. 30 tablet 0  . ramipril (ALTACE) 2.5 MG capsule Take 2.5 mg by mouth daily.    . ramipril (ALTACE) 2.5 MG capsule Take 1 capsule (2.5 mg total) by mouth daily. 90 capsule 1  . Tamsulosin HCl (FLOMAX) 0.4 MG CAPS Take 1 capsule (0.4 mg total) by mouth at bedtime. 30 capsule 2   No current facility-administered medications on file prior to visit.     REVIEW OF SYSTEMS: Cardiovascular: No chest pain, chest pressure, palpitations, orthopnea, or dyspnea on exertion. No claudication or rest pain,  No history of DVT or phlebitis. Pulmonary: No productive cough, asthma or wheezing. Neurologic: No weakness, paresthesias, aphasia, or amaurosis. No dizziness. Hematologic: No bleeding problems or clotting disorders. Musculoskeletal: No joint pain or joint swelling. Gastrointestinal: No blood in stool or hematemesis Genitourinary: No dysuria or hematuria. Psychiatric:: No history of major depression. Integumentary: No rashes or ulcers. Constitutional: No fever or chills.  PHYSICAL EXAMINATION:   Vital signs are BP 119/71 mmHg  Pulse 82  Resp 16  Ht 6' (1.829 m)  Wt 164 lb 6.4 oz (74.571 kg)  BMI 22.29 kg/m2 General: The patient appears their stated age. HEENT:  No gross abnormalities Pulmonary:  Non labored breathing Abdomen: Soft and non-tender Musculoskeletal: There are no major deformities. Neurologic: No focal weakness or paresthesias are detected, Skin: There are no ulcer or rashes noted. Psychiatric: The patient has normal  affect. Cardiovascular: There is a regular rate and rhythm without significant murmur appreciated.   Diagnostic Studies Ultrasound was ordered and reviewed today by myself.  There is no evidence of endoleak.  Maximum aortic diameter is 5.0.  Assessment: Status post endovascular aneurysm repair Plan: The patient continues to do very well.  The maximum diameter of his aneurysm is now down to 5.0 cm.  There is no evidence of endoleak.  The patient will follow-up with me  in one year with a repeat ultrasound of his abdominal aorta and endograft.  The patient will continue to get his surveillance of his carotid and lower extremity arteries by Dr. Lurline Hare. Leia Alf, M.D. [facility] [place] Office: [phone] Pager:  [phone]

## 2014-09-30 NOTE — Addendum Note (Signed)
Addended by: Mena Goes on: 09/30/2014 12:19 PM   Modules accepted: Orders

## 2014-10-11 ENCOUNTER — Other Ambulatory Visit (HOSPITAL_COMMUNITY): Payer: Self-pay | Admitting: Cardiovascular Disease

## 2014-10-11 DIAGNOSIS — I739 Peripheral vascular disease, unspecified: Secondary | ICD-10-CM

## 2014-10-31 ENCOUNTER — Encounter (HOSPITAL_COMMUNITY): Payer: Self-pay | Admitting: Vascular Surgery

## 2014-11-01 ENCOUNTER — Ambulatory Visit (HOSPITAL_COMMUNITY)
Admission: RE | Admit: 2014-11-01 | Discharge: 2014-11-01 | Disposition: A | Discharge status: Home / Self Care | Payer: Medicare Other | Source: Ambulatory Visit | Attending: Cardiovascular Disease | Admitting: Cardiovascular Disease

## 2014-11-01 DIAGNOSIS — I739 Peripheral vascular disease, unspecified: Secondary | ICD-10-CM | POA: Diagnosis present

## 2014-11-01 NOTE — Progress Notes (Signed)
Lower Extremity Arterial Duplex Completed. °Brianna L Mazza,RVT °

## 2014-11-12 ENCOUNTER — Encounter: Payer: Self-pay | Admitting: *Deleted

## 2014-12-04 ENCOUNTER — Encounter: Payer: Self-pay | Admitting: Cardiovascular Disease

## 2014-12-04 ENCOUNTER — Ambulatory Visit (INDEPENDENT_AMBULATORY_CARE_PROVIDER_SITE_OTHER): Payer: 59 | Admitting: Cardiovascular Disease

## 2014-12-04 VITALS — BP 120/80 | HR 96 | Ht 73.0 in | Wt 171.2 lb

## 2014-12-04 DIAGNOSIS — I714 Abdominal aortic aneurysm, without rupture, unspecified: Secondary | ICD-10-CM

## 2014-12-04 DIAGNOSIS — E785 Hyperlipidemia, unspecified: Secondary | ICD-10-CM

## 2014-12-04 DIAGNOSIS — R350 Frequency of micturition: Secondary | ICD-10-CM | POA: Diagnosis not present

## 2014-12-04 DIAGNOSIS — Z720 Tobacco use: Secondary | ICD-10-CM

## 2014-12-04 DIAGNOSIS — I2510 Atherosclerotic heart disease of native coronary artery without angina pectoris: Secondary | ICD-10-CM

## 2014-12-04 DIAGNOSIS — R5383 Other fatigue: Secondary | ICD-10-CM | POA: Diagnosis not present

## 2014-12-04 DIAGNOSIS — Z79899 Other long term (current) drug therapy: Secondary | ICD-10-CM | POA: Diagnosis not present

## 2014-12-04 DIAGNOSIS — I739 Peripheral vascular disease, unspecified: Secondary | ICD-10-CM

## 2014-12-04 DIAGNOSIS — I779 Disorder of arteries and arterioles, unspecified: Secondary | ICD-10-CM

## 2014-12-04 NOTE — Assessment & Plan Note (Signed)
History of remote bilateral carotid endarterectomies with recent carotid Doppler studies revealing widely patent internal carotid artery endarterectomy sites. This was performed 12/20/13. There are no bruits. We'll wait another 12 months prior to reimaging him.

## 2014-12-04 NOTE — Assessment & Plan Note (Signed)
Note occluded right SFA with high-grade left SFA stenosis demonstrated both angiographically and by duplex ultrasound although the patient denies claudication.

## 2014-12-04 NOTE — Assessment & Plan Note (Signed)
History of hyperlipidemia on statin therapy. We will recheck a lipid and liver profile 

## 2014-12-04 NOTE — Patient Instructions (Addendum)
Dr. Gwenlyn Found has ordered for you to have lab work done today.  Your physician wants you to follow-up in 1 year with Dr. Gwenlyn Found. You will receive a reminder letter in the mail 2 months in advance. If you do not receive a letter, please call our office to schedule the follow-up appointment.  Echocardiogram. Echocardiography is a painless test that uses sound waves to create images of your heart. It provides your doctor with information about the size and shape of your heart and how well your heart's chambers and valves are working. This procedure takes approximately one hour. There are no restrictions for this procedure.

## 2014-12-04 NOTE — Assessment & Plan Note (Signed)
History of abdominal aortic aneurysm repair performed endovascularly by Dr. Trula Slade 02/14/14 with follow-up by him showing slight reduction in aneurysm size by CT

## 2014-12-04 NOTE — Progress Notes (Signed)
12/04/2014 Tawni Millers   08-23-1946  [id number]  Primary Physician Sallee Lange, MD Primary Cardiologist: Lorretta Harp MD [place], [place]   HPI:  Jim [name] is a [phone]. male with h/o PVD, CAD (s/p coronary artery bypass grafting in 2001 with LIMA to his LAD, vein to obtuse marginal branch and to the RCA), AAA and prostate cancer who presents for yearly follow up. I last saw him one year ago.He reports feeling well.since I saw him a year ago he stopped smoking this past summer using Chantix. He has since retired as well. He had a lower extremity arterial duplex done on 11/01/13 which showed increase in diameter of AAA from 4.9x4.7cm on 03/05/13 to 5.1x5.0cm. He was also found to have a new aneurysmal dilatation of the left CIA. It also showed worsening occlusion in the SFA.  He had both iliac arteries stented in December 2001. He also underwent bilateral carotid endarterectomy in 2008. I sent him to Dr. Trula Slade for evaluation of his abdominal aortic area resumed. He had this repaired endovascularly 02/14/14. A Myoview stress test done prior to that revealed a large inferolateral scar with an ejection fraction of 30%. He currently denies chest pain, shortness of breath or claudication.  Current Outpatient Prescriptions  Medication Sig Dispense Refill  . ammonium lactate (AMLACTIN) 12 % cream Apply 1 g topically 2 (two) times daily as needed for dry skin.    Marland Kitchen aspirin 81 MG tablet Take 81 mg by mouth daily.      Marland Kitchen atorvastatin (LIPITOR) 80 MG tablet Take 1 tablet (80 mg total) by mouth daily. 90 tablet 1  . calcium carbonate (TUMS - DOSED IN MG ELEMENTAL CALCIUM) 500 MG chewable tablet Chew 1,000 mg by mouth daily.      . clopidogrel (PLAVIX) 75 MG tablet Take 1 tablet (75 mg total) by mouth daily. 90 tablet 1  . folic acid (FOLVITE) 734 MCG tablet Take 400 mcg by mouth daily.      . metoprolol tartrate (LOPRESSOR) 25 MG tablet Take 12.5mg  (1/2 tablet) by mouth in the  morning and 25mg  (1 tablet) at bedtime. 135 tablet 1  . Multiple Vitamin (MULTIVITAMIN) capsule Take 1 capsule by mouth daily.      . niacin (NIASPAN) 1000 MG CR tablet Take 1 tablet (1,000 mg total) by mouth at bedtime. 90 tablet 1  . ramipril (ALTACE) 2.5 MG capsule Take 2.5 mg by mouth daily.    . Tamsulosin HCl (FLOMAX) 0.4 MG CAPS Take 1 capsule (0.4 mg total) by mouth at bedtime. 30 capsule 2   No current facility-administered medications for this visit.    No Known Allergies  History   Social History  . Marital Status: Single    Spouse Name: N/A    Number of Children: 0  . Years of Education: N/A   Occupational History  .  [employer]   Social History Main Topics  . Smoking status: Former Smoker -- 1.00 packs/day for 15 years    Types: Cigarettes    Quit date: 11/23/2003  . Smokeless tobacco: Never Used     Comment: quit 10 years ago  . Alcohol Use: Yes     Comment: occasional  . Drug Use: No  . Sexual Activity: Not on file   Other Topics Concern  . Not on file   Social History Narrative     Review of Systems: General: negative for chills, fever, night sweats or weight changes.  Cardiovascular: negative for chest  pain, dyspnea on exertion, edema, orthopnea, palpitations, paroxysmal nocturnal dyspnea or shortness of breath Dermatological: negative for rash Respiratory: negative for cough or wheezing Urologic: negative for hematuria Abdominal: negative for nausea, vomiting, diarrhea, bright red blood per rectum, melena, or hematemesis Neurologic: negative for visual changes, syncope, or dizziness All other systems reviewed and are otherwise negative except as noted above.    Blood pressure 120/80, pulse 96, height 6\' 1"  (1.854 m), weight 171 lb 3.2 oz (77.656 kg).  General appearance: alert and no distress Neck: no adenopathy, no carotid bruit, no JVD, supple, symmetrical, trachea midline and thyroid not enlarged, symmetric, no  tenderness/mass/nodules Lungs: clear to auscultation bilaterally Heart: regular rate and rhythm, S1, S2 normal, no murmur, click, rub or gallop Extremities: extremities normal, atraumatic, no cyanosis or edema  EKG sinus tachycardia at 111 without ST or T-wave changes. There were inferior Q waves. I personally reviewed this EKG  ASSESSMENT AND PLAN:   Tobacco abuse disorder The patient has discontinued tobacco this past summer after using Chantix   Peripheral arterial occlusive disease Note occluded right SFA with high-grade left SFA stenosis demonstrated both angiographically and by duplex ultrasound although the patient denies claudication.   Hyperlipidemia History of hyperlipidemia on statin therapy. We will recheck a lipid and liver profile   Coronary artery disease History of coronary artery disease status post coronary artery bypass grafting in 2001 LIMA to his LAD, vein to a marginal branch and to the RCA. His last functional study performed 12/05/13 revealed a large inferolateral scar with an ejection fraction of 30%. The patient is asymptomatic from this. I am going to get a 2-D echocardiogram to further evaluate his left ventricular function.   Carotid artery disease History of remote bilateral carotid endarterectomies with recent carotid Doppler studies revealing widely patent internal carotid artery endarterectomy sites. This was performed 12/20/13. There are no bruits. We'll wait another 12 months prior to reimaging him.   AAA (abdominal aortic aneurysm) without rupture History of abdominal aortic aneurysm repair performed endovascularly by Dr. Trula Slade 02/14/14 with follow-up by him showing slight reduction in aneurysm size by CT       Lorretta Harp MD [facility], [facility] 12/04/2014 9:21 AM

## 2014-12-04 NOTE — Assessment & Plan Note (Signed)
The patient has discontinued tobacco this past summer after using Chantix

## 2014-12-04 NOTE — Progress Notes (Signed)
Last wellness exam years ago. Please send patient a card letting him know he ought to schedule for 8 office visit

## 2014-12-04 NOTE — Assessment & Plan Note (Addendum)
History of coronary artery disease status post coronary artery bypass grafting in 2001 LIMA to his LAD, vein to a marginal branch and to the RCA. His last functional study performed 12/05/13 revealed a large inferolateral scar with an ejection fraction of 30%. The patient is asymptomatic from this. I am going to get a 2-D echocardiogram to further evaluate his left ventricular function.

## 2014-12-05 ENCOUNTER — Other Ambulatory Visit (HOSPITAL_COMMUNITY): Payer: Self-pay | Admitting: Cardiovascular Disease

## 2014-12-05 LAB — CBC WITH DIFFERENTIAL/PLATELET
BASOS PCT: 0 % (ref 0–1)
Basophils Absolute: 0 10*3/uL (ref 0.0–0.1)
Eosinophils Absolute: 0.1 10*3/uL (ref 0.0–0.7)
Eosinophils Relative: 1 % (ref 0–5)
HCT: 46.1 % (ref 39.0–52.0)
Hemoglobin: 15.2 g/dL (ref 13.0–17.0)
LYMPHS PCT: 20 % (ref 12–46)
Lymphs Abs: 1.3 10*3/uL (ref 0.7–4.0)
MCH: 29.3 pg (ref 26.0–34.0)
MCHC: 33 g/dL (ref 30.0–36.0)
MCV: 88.8 fL (ref 78.0–100.0)
MONO ABS: 0.4 10*3/uL (ref 0.1–1.0)
MONOS PCT: 6 % (ref 3–12)
MPV: 9.3 fL (ref 8.6–12.4)
NEUTROS ABS: 4.7 10*3/uL (ref 1.7–7.7)
Neutrophils Relative %: 73 % (ref 43–77)
PLATELETS: 179 10*3/uL (ref 150–400)
RBC: 5.19 MIL/uL (ref 4.22–5.81)
RDW: 12.8 % (ref 11.5–15.5)
WBC: 6.4 10*3/uL (ref 4.0–10.5)

## 2014-12-05 LAB — COMPREHENSIVE METABOLIC PANEL
ALT: 25 U/L (ref 0–53)
AST: 21 U/L (ref 0–37)
Albumin: 4 g/dL (ref 3.5–5.2)
Alkaline Phosphatase: 114 U/L (ref 39–117)
BUN: 15 mg/dL (ref 6–23)
CHLORIDE: 104 meq/L (ref 96–112)
CO2: 30 mEq/L (ref 19–32)
Calcium: 9.2 mg/dL (ref 8.4–10.5)
Creat: 0.95 mg/dL (ref 0.50–1.35)
GLUCOSE: 129 mg/dL — AB (ref 70–99)
Potassium: 5 mEq/L (ref 3.5–5.3)
Sodium: 140 mEq/L (ref 135–145)
Total Bilirubin: 0.6 mg/dL (ref 0.2–1.2)
Total Protein: 6.6 g/dL (ref 6.0–8.3)

## 2014-12-05 LAB — LIPID PANEL
Cholesterol: 183 mg/dL (ref 0–200)
HDL: 48 mg/dL (ref >39)
LDL Cholesterol: 116 mg/dL — ABNORMAL HIGH (ref 0–99)
TRIGLYCERIDES: 93 mg/dL (ref <150)
Total CHOL/HDL Ratio: 3.8 Ratio
VLDL: 19 mg/dL (ref 0–40)

## 2014-12-05 LAB — TSH: TSH: 2.465 u[IU]/mL (ref 0.350–4.500)

## 2014-12-05 NOTE — Telephone Encounter (Signed)
Rx(s) sent to pharmacy electronically.  

## 2014-12-06 LAB — PSA, TOTAL AND FREE
PSA FREE PCT: 10 % — AB (ref >25)
PSA, Free: <0.1 ng/mL
PSA: 0.42 ng/mL (ref <=4.00)

## 2014-12-09 ENCOUNTER — Ambulatory Visit (HOSPITAL_COMMUNITY)
Admission: RE | Admit: 2014-12-09 | Discharge: 2014-12-09 | Disposition: A | Discharge status: Home / Self Care | Payer: Medicare Other | Source: Ambulatory Visit | Attending: Cardiology | Admitting: Cardiology

## 2014-12-09 DIAGNOSIS — I519 Heart disease, unspecified: Secondary | ICD-10-CM

## 2014-12-09 DIAGNOSIS — I2510 Atherosclerotic heart disease of native coronary artery without angina pectoris: Secondary | ICD-10-CM | POA: Diagnosis not present

## 2014-12-09 NOTE — Progress Notes (Signed)
2D Echo Performed 12/09/2014    Marygrace Drought, RCS

## 2014-12-10 ENCOUNTER — Other Ambulatory Visit: Payer: Self-pay | Admitting: Cardiovascular Disease

## 2014-12-10 ENCOUNTER — Encounter: Payer: Self-pay | Admitting: *Deleted

## 2014-12-22 ENCOUNTER — Other Ambulatory Visit: Payer: Self-pay | Admitting: Cardiovascular Disease

## 2014-12-23 NOTE — Telephone Encounter (Signed)
Rx refill sent to patient pharmacy   

## 2014-12-30 ENCOUNTER — Other Ambulatory Visit: Payer: Self-pay | Admitting: Cardiovascular Disease

## 2014-12-30 NOTE — Telephone Encounter (Signed)
Rx(s) sent to pharmacy electronically.  

## 2014-12-31 DIAGNOSIS — Z8546 Personal history of malignant neoplasm of prostate: Secondary | ICD-10-CM | POA: Diagnosis not present

## 2015-01-06 DIAGNOSIS — M549 Dorsalgia, unspecified: Secondary | ICD-10-CM | POA: Diagnosis not present

## 2015-01-06 DIAGNOSIS — M62838 Other muscle spasm: Secondary | ICD-10-CM | POA: Diagnosis not present

## 2015-01-07 DIAGNOSIS — Z8546 Personal history of malignant neoplasm of prostate: Secondary | ICD-10-CM | POA: Diagnosis not present

## 2015-01-21 DIAGNOSIS — R05 Cough: Secondary | ICD-10-CM | POA: Diagnosis not present

## 2015-01-21 DIAGNOSIS — J4521 Mild intermittent asthma with (acute) exacerbation: Secondary | ICD-10-CM | POA: Diagnosis not present

## 2015-01-21 DIAGNOSIS — R062 Wheezing: Secondary | ICD-10-CM | POA: Diagnosis not present

## 2015-07-04 ENCOUNTER — Other Ambulatory Visit: Payer: Self-pay | Admitting: Cardiovascular Disease

## 2015-07-04 NOTE — Telephone Encounter (Signed)
Niacin refilled for 1 year supply Feb 2016

## 2015-07-10 DIAGNOSIS — F17200 Nicotine dependence, unspecified, uncomplicated: Secondary | ICD-10-CM | POA: Diagnosis not present

## 2015-07-10 DIAGNOSIS — R7309 Other abnormal glucose: Secondary | ICD-10-CM | POA: Diagnosis not present

## 2015-08-06 DIAGNOSIS — J029 Acute pharyngitis, unspecified: Secondary | ICD-10-CM | POA: Diagnosis not present

## 2015-08-06 DIAGNOSIS — J40 Bronchitis, not specified as acute or chronic: Secondary | ICD-10-CM | POA: Diagnosis not present

## 2015-10-02 ENCOUNTER — Encounter: Payer: Self-pay | Admitting: Family

## 2015-10-06 ENCOUNTER — Other Ambulatory Visit: Payer: Self-pay | Admitting: Surgery

## 2015-10-06 ENCOUNTER — Other Ambulatory Visit (HOSPITAL_COMMUNITY): Payer: Medicare Other

## 2015-10-06 ENCOUNTER — Ambulatory Visit (HOSPITAL_COMMUNITY)
Admission: RE | Admit: 2015-10-06 | Discharge: 2015-10-06 | Disposition: A | Discharge status: Home / Self Care | Payer: Medicare Other | Source: Ambulatory Visit | Attending: Surgery | Admitting: Surgery

## 2015-10-06 ENCOUNTER — Encounter: Payer: Self-pay | Admitting: Family

## 2015-10-06 ENCOUNTER — Ambulatory Visit: Payer: Medicare Other | Admitting: Family

## 2015-10-06 ENCOUNTER — Ambulatory Visit (INDEPENDENT_AMBULATORY_CARE_PROVIDER_SITE_OTHER): Payer: Medicare Other | Admitting: Family

## 2015-10-06 VITALS — BP 126/81 | HR 78 | Temp 97.3°F | Resp 16 | Ht 73.0 in | Wt 170.0 lb

## 2015-10-06 DIAGNOSIS — I714 Abdominal aortic aneurysm, without rupture, unspecified: Secondary | ICD-10-CM

## 2015-10-06 DIAGNOSIS — Z48812 Encounter for surgical aftercare following surgery on the circulatory system: Secondary | ICD-10-CM

## 2015-10-06 DIAGNOSIS — Z95828 Presence of other vascular implants and grafts: Secondary | ICD-10-CM | POA: Diagnosis not present

## 2015-10-06 NOTE — Progress Notes (Signed)
[facility] [place]  Established [facility]  History of Present Illness  Jim [name] is a 69 y.o. (1946/08/17) male patient of Dr. Trula Slade is back today for followup. On 02/14/2014, he underwent endovascular repair of abdominal aortic aneurysm. Of note the patient had a left external iliac stent and a right common iliac stent which had to be negotiated. His postoperative course was uncomplicated and he was discharged home on postoperative day #1. At his initial CT scan, the aneurysm had decreased from 5.5 cm down to 5.3 cm. There was no evidence of endoleak. He has no complaints today.  Pt Diabetic: No Pt smoker: former smoker, quit September 2016   Past Medical History  Diagnosis Date  . Prostate cancer 2012    XRT, seed implants  . Carotid artery disease   . PVD (peripheral vascular disease)     Known iliac and SFA disease as well as a moderately sized abdominal aortic aneurism.  Marland Kitchen CAD (coronary artery disease)         . AAA (abdominal aortic aneurysm)     Infrarenal abdominal aortic aneurysm of 48 mm, ct 5/12, followed by Dr. Quay Burow.  . Hyperlipidemia   . Hypertension   . Myocardial infarction ([place])   . Neuromuscular disorder 2002  . Hx of radiation therapy 01/13/11-03/09/11    prostate  . Hx of echocardiogram 01/26/2010    showed EF of 45% to 50% with basal and mid inferior/inferoseptal hypokinesia.  Marland Kitchen History of stress test 10/27/10    showed inferior posterior scar without ischemia, EF was 31% according to the myoview  . Tobacco abuse   . History of shingles   . S/P abdominal aortic aneurysm repair     performed by Dr. Trula Slade on 02/14/14 with a exclude or   Past Surgical History  Procedure Laterality Date  . Inguinal hernia repair      bilateral  . Hernia repair      periumbilical  . Coronary artery bypass graft  2001    with a LIMA to his LAD, vein to an obtuse marginal branch and to the RCA  . Bilateral lower extremity stents       right common femoral and left external iliac  . Carotid endarterectomy Bilateral     bilateral, right in 2009 and left in 2001  . Colonoscopy  08/09/2011    Procedure: COLONOSCOPY;  Surgeon: Daneil Dolin, MD;  [facility];  Service: Endoscopy;  Laterality: N/A;  9:15  . Cholecystectomy    . Abdominal aortic endovascular stent graft Bilateral 02/14/2014    Procedure: ABDOMINAL AORTIC ENDOVASCULAR STENT GRAFT- GORE;  Surgeon: Serafina Mitchell, MD;  Location: [place];  Service: Vascular;  Laterality: Bilateral;  . Abdominal aortagram N/A 01/29/2014    Procedure: ABDOMINAL Maxcine Ham;  Surgeon: Angelia Mould, MD;  Location: [facility] CATH LAB;  Service: Cardiovascular;  Laterality: N/A;   Social History Social History  Substance Use Topics  . Smoking status: Former Smoker -- 1.00 packs/day for 15 years    Types: Cigarettes    Quit date: 11/23/2003  . Smokeless tobacco: Never Used     Comment: quit 10 years ago  . Alcohol Use: Yes     Comment: occasional   Family History Family History  Problem Relation Age of Onset  . Heart attack Father   . Prostate cancer Father     mets to bone  . Cancer Father   . Heart disease Father   .  Cancer Mother     ?   Current Outpatient Prescriptions on File Prior to Visit  Medication Sig Dispense Refill  . ammonium lactate (AMLACTIN) 12 % cream Apply 1 g topically 2 (two) times daily as needed for dry skin.    Marland Kitchen aspirin 81 MG tablet Take 81 mg by mouth daily.      Marland Kitchen atorvastatin (LIPITOR) 80 MG tablet TAKE 1 TABLET (80 MG TOTAL) BY MOUTH DAILY. 90 tablet 3  . calcium carbonate (TUMS - DOSED IN MG ELEMENTAL CALCIUM) 500 MG chewable tablet Chew 1,000 mg by mouth daily.      . clopidogrel (PLAVIX) 75 MG tablet TAKE 1 TABLET (75 MG TOTAL) BY MOUTH DAILY. 90 tablet 3  . folic acid (FOLVITE) Q000111Q MCG tablet Take 400 mcg by mouth daily.      . metoprolol tartrate (LOPRESSOR) 25 MG tablet TAKE 12.5MG  (1/2 TABLET) BY MOUTH IN THE MORNING AND 25MG   (1 TABLET) AT BEDTIME. 135 tablet 3  . Multiple Vitamin (MULTIVITAMIN) capsule Take 1 capsule by mouth daily.      . niacin (NIASPAN) 1000 MG CR tablet TAKE 1 TABLET (1,000 MG TOTAL) BY MOUTH AT BEDTIME. 90 tablet 3  . ramipril (ALTACE) 2.5 MG capsule Take 2.5 mg by mouth daily.    . ramipril (ALTACE) 2.5 MG capsule TAKE 1 CAPSULE (2.5 MG TOTAL) BY MOUTH DAILY. 90 capsule 3  . Tamsulosin HCl (FLOMAX) 0.4 MG CAPS Take 1 capsule (0.4 mg total) by mouth at bedtime. 30 capsule 2   No current facility-administered medications on file prior to visit.   No Known Allergies   ROS: See HPI for pertinent positives and negatives.  Physical Examination  Filed Vitals:   10/06/15 0905  BP: 126/81  Pulse: 78  Temp: 97.3 F (36.3 C)  TempSrc: Oral  Resp: 16  Height: 6\' 1"  (1.854 m)  Weight: 170 lb (77.111 kg)  SpO2: 99%   Body mass index is 22.43 kg/(m^2).  General: The patient appears their stated age. HEENT: No gross abnormalities Pulmonary: Non labored breathing Abdomen: Soft and non-tender Musculoskeletal: There are no major deformities. Neurologic: No focal weakness or paresthesias are detected. Skin: There are no ulcers or rashes noted. Psychiatric: The patient has normal affect. Cardiovascular: There is a regular rate and rhythm without significant murmur appreciated. Pedal pulses are not palpable, bilateral femoral pulses are 1+ palpable.    Non-Invasive Vascular Imaging  EVAR Duplex (Date: 10/06/2015) ABDOMINAL AORTA DUPLEX EVALUATION - POST ENDOVASCULAR REPAIR    INDICATION: Abdominal aortic aneurysm    PREVIOUS INTERVENTION(S): Endovascular Aortic Repair performed 02/14/2014.    DUPLEX EXAM:      DIAMETER AP (cm) DIAMETER TRANSVERSE (cm) VELOCITIES (cm/sec)  Aorta 4.74 5.13 43  Right Common Iliac 1.20 1.45 35  Left Common Iliac 1.27 1.40 34    Comparison Study       Date DIAMETER AP (cm) DIAMETER TRANSVERSE (cm)  09/30/2014 5.0 5.0     ADDITIONAL FINDINGS:      IMPRESSION: Abdominal aortic sac present measuring 4.74cm AP x 5.13cm TRV, no extraluminal color or spectral Doppler waveform is detected.    Compared to the previous exam:  Stable diameters since previous study on 09/30/2014.      Medical Decision Making  ADOLPHO URSIN is a 69 y.o. male who presents s/p EVAR (Date: 02/14/14).  Pt is asymptomatic with stable sac size.  I discussed with the patient the importance of surveillance of the endograft.  The next endograft duplex  will be scheduled for 12 months.  The patient will follow up with [place] in 12 months with these studies.       The patient will continue to get his surveillance of his carotid and lower extremity arteries by Dr. Gwenlyn Found.  I emphasized the importance of maximal medical management including strict control of blood pressure, blood glucose, and lipid levels, antiplatelet agents, obtaining regular exercise, and cessation of smoking.   Thank you for allowing [place] to participate in this patient's care.  Clemon Chambers, RN, MSN, FNP-C [facility] [place] Office: [phone]  Clinic Physician: Trula Slade  10/06/2015, 8:56 AM

## 2015-10-07 ENCOUNTER — Other Ambulatory Visit (HOSPITAL_COMMUNITY): Payer: Medicare Other

## 2015-10-07 ENCOUNTER — Ambulatory Visit: Payer: Medicare Other | Admitting: Family

## 2015-10-13 DIAGNOSIS — J0100 Acute maxillary sinusitis, unspecified: Secondary | ICD-10-CM | POA: Diagnosis not present

## 2015-10-29 ENCOUNTER — Other Ambulatory Visit: Payer: Self-pay | Admitting: Cardiovascular Disease

## 2015-10-29 DIAGNOSIS — I700 Atherosclerosis of aorta: Secondary | ICD-10-CM

## 2015-10-29 DIAGNOSIS — I739 Peripheral vascular disease, unspecified: Secondary | ICD-10-CM

## 2015-11-05 ENCOUNTER — Telehealth: Payer: Self-pay | Admitting: Cardiovascular Disease

## 2015-11-05 ENCOUNTER — Ambulatory Visit (HOSPITAL_COMMUNITY)
Admission: RE | Admit: 2015-11-05 | Discharge: 2015-11-05 | Disposition: A | Discharge status: Home / Self Care | Payer: Medicare Other | Source: Ambulatory Visit | Attending: Cardiology | Admitting: Cardiology

## 2015-11-05 ENCOUNTER — Ambulatory Visit (HOSPITAL_COMMUNITY)
Admission: RE | Admit: 2015-11-05 | Discharge: 2015-11-05 | Disposition: A | Discharge status: Home / Self Care | Payer: Medicare Other | Source: Ambulatory Visit | Attending: Cardiovascular Disease | Admitting: Cardiovascular Disease

## 2015-11-05 DIAGNOSIS — I700 Atherosclerosis of aorta: Secondary | ICD-10-CM | POA: Diagnosis not present

## 2015-11-05 DIAGNOSIS — F17200 Nicotine dependence, unspecified, uncomplicated: Secondary | ICD-10-CM | POA: Insufficient documentation

## 2015-11-05 DIAGNOSIS — I10 Essential (primary) hypertension: Secondary | ICD-10-CM | POA: Diagnosis not present

## 2015-11-05 DIAGNOSIS — E785 Hyperlipidemia, unspecified: Secondary | ICD-10-CM | POA: Diagnosis not present

## 2015-11-05 DIAGNOSIS — R938 Abnormal findings on diagnostic imaging of other specified body structures: Secondary | ICD-10-CM | POA: Insufficient documentation

## 2015-11-05 DIAGNOSIS — I739 Peripheral vascular disease, unspecified: Secondary | ICD-10-CM | POA: Diagnosis not present

## 2015-11-05 NOTE — Telephone Encounter (Signed)
Patient's last carotid doppler result note stated "Essentially normal study. Repeat when clinically indicated." Confirmed patient's appointment in January. Informed patient that Dr. Gwenlyn Found would order carotid doppler at his appointment if needed. Patient verbalized understanding.

## 2015-11-05 NOTE — Telephone Encounter (Signed)
Patient wants to know if he needs to have carotid dopplers before he sees Dr. Gwenlyn Found in January?  States he has not had one in a year.  No order/recall in epic.

## 2015-12-02 DIAGNOSIS — Z23 Encounter for immunization: Secondary | ICD-10-CM | POA: Diagnosis not present

## 2015-12-02 DIAGNOSIS — R0981 Nasal congestion: Secondary | ICD-10-CM | POA: Diagnosis not present

## 2015-12-02 DIAGNOSIS — R05 Cough: Secondary | ICD-10-CM | POA: Diagnosis not present

## 2015-12-09 ENCOUNTER — Ambulatory Visit (INDEPENDENT_AMBULATORY_CARE_PROVIDER_SITE_OTHER): Payer: Medicare Other | Admitting: Cardiovascular Disease

## 2015-12-09 ENCOUNTER — Encounter: Payer: Self-pay | Admitting: Cardiovascular Disease

## 2015-12-09 VITALS — BP 118/66 | HR 82 | Ht 73.0 in | Wt 172.0 lb

## 2015-12-09 DIAGNOSIS — E785 Hyperlipidemia, unspecified: Secondary | ICD-10-CM

## 2015-12-09 DIAGNOSIS — I779 Disorder of arteries and arterioles, unspecified: Secondary | ICD-10-CM | POA: Diagnosis not present

## 2015-12-09 DIAGNOSIS — I2510 Atherosclerotic heart disease of native coronary artery without angina pectoris: Secondary | ICD-10-CM | POA: Diagnosis not present

## 2015-12-09 DIAGNOSIS — Z720 Tobacco use: Secondary | ICD-10-CM | POA: Diagnosis not present

## 2015-12-09 DIAGNOSIS — I739 Peripheral vascular disease, unspecified: Secondary | ICD-10-CM

## 2015-12-09 NOTE — Progress Notes (Signed)
12/09/2015 Jim [name]   04/28/1946  [id number]  Primary Physician Sallee Lange, MD Primary Cardiologist: Lorretta Harp MD [place], [place]   HPI:  Jim [name] is a 70y.o. male with h/o PVD, CAD (s/p coronary artery bypass grafting in 2001 with LIMA to his LAD, vein to obtuse marginal branch and to the RCA), AAA and prostate cancer who presents for yearly follow up. I last saw him 12/04/14. He tells me that he's probably decide to get married next month and is moving to [place].  Marland KitchenHe reports feeling well.since I saw him a year ago . He stopped smoking summer 2015 using Chantix. He has since retired as well. He had a lower extremity arterial duplex done on 11/01/13 which showed increase in diameter of AAA from 4.9x4.7cm on 03/05/13 to 5.1x5.0cm. He was also found to have a new aneurysmal dilatation of the left CIA. It also showed worsening occlusion in the SFA.  He had both iliac arteries stented in December 2001. He also underwent bilateral carotid endarterectomy in 2008. I sent him to Dr. Trula Slade for evaluation of his abdominal aortic area resumed. He had this repaired endovascularly 02/14/14. A Myoview stress test done prior to that revealed a large inferolateral scar with an ejection fraction of 30%. He currently denies chest pain, shortness of breath or claudication.    Current Outpatient Prescriptions  Medication Sig Dispense Refill  . aspirin 81 MG tablet Take 81 mg by mouth daily.      Marland Kitchen atorvastatin (LIPITOR) 80 MG tablet TAKE 1 TABLET (80 MG TOTAL) BY MOUTH DAILY. 90 tablet 3  . calcium carbonate (TUMS - DOSED IN MG ELEMENTAL CALCIUM) 500 MG chewable tablet Chew 1,000 mg by mouth daily.      . clopidogrel (PLAVIX) 75 MG tablet TAKE 1 TABLET (75 MG TOTAL) BY MOUTH DAILY. 90 tablet 3  . folic acid (FOLVITE) Q000111Q MCG tablet Take 400 mcg by mouth daily.      . metoprolol tartrate (LOPRESSOR) 25 MG tablet TAKE 12.5MG  (1/2 TABLET) BY MOUTH IN THE MORNING AND  25MG  (1 TABLET) AT BEDTIME. 135 tablet 3  . Multiple Vitamin (MULTIVITAMIN) capsule Take 1 capsule by mouth daily.      . niacin (NIASPAN) 1000 MG CR tablet TAKE 1 TABLET (1,000 MG TOTAL) BY MOUTH AT BEDTIME. 90 tablet 3  . ramipril (ALTACE) 2.5 MG capsule TAKE 1 CAPSULE (2.5 MG TOTAL) BY MOUTH DAILY. 90 capsule 3   No current facility-administered medications for this visit.    No Known Allergies  Social History   Social History  . Marital Status: Single    Spouse Name: N/A  . Number of Children: 0  . Years of Education: N/A   Occupational History  .  [employer]   Social History Main Topics  . Smoking status: Former Smoker -- 1.00 packs/day for 15 years    Types: Cigarettes    Quit date: 11/23/2003  . Smokeless tobacco: Never Used     Comment: quit 10 years ago  . Alcohol Use: Yes     Comment: occasional  . Drug Use: No  . Sexual Activity: Not on file   Other Topics Concern  . Not on file   Social History Narrative     Review of Systems: General: negative for chills, fever, night sweats or weight changes.  Cardiovascular: negative for chest pain, dyspnea on exertion, edema, orthopnea, palpitations, paroxysmal nocturnal dyspnea or shortness of breath Dermatological: negative for rash Respiratory: negative  for cough or wheezing Urologic: negative for hematuria Abdominal: negative for nausea, vomiting, diarrhea, bright red blood per rectum, melena, or hematemesis Neurologic: negative for visual changes, syncope, or dizziness All other systems reviewed and are otherwise negative except as noted above.    Blood pressure 118/66, pulse 82, height 6\' 1"  (1.854 m), weight 172 lb (78.019 kg).  General appearance: alert and no distress Neck: no adenopathy, no carotid bruit, no JVD, supple, symmetrical, trachea midline and thyroid not enlarged, symmetric, no tenderness/mass/nodules Lungs: clear to auscultation bilaterally Heart: regular rate and rhythm, S1, S2  normal, no murmur, click, rub or gallop Extremities: extremities normal, atraumatic, no cyanosis or edema  EKG sinus rhythm at 82 with small inferior Q waves. I personally reviewed this EKG  ASSESSMENT AND PLAN:   Peripheral arterial occlusive disease History of peripheral arterial disease status post bilateral iliac stenting by myself December 2001. He had an endoluminal stent graft for abdominal aortic aneurysm performed by Dr. Trula Slade 02/14/14. He has a known occluded right SFA and denies claudication. Follow-up Dopplers recently performed 11/05/15 revealed a right ABI 0.55 and left of 0.7. His iliac stents remain patent. He denies claudication.  Hyperlipidemia History of hyperlipidemia on atorvastatin. We will recheck a lipid and liver profile  Coronary artery disease History of coronary artery disease status post bypass grafting in 2001 with a LIMA to his LAD, vein to obtuse marginal branch and to the RCA. A Myoview stress test performed prior to his endoluminal stent graft revealed a large inferolateral scar with ejection fraction of 30%. He denies chest pain or shortness of breath.  Carotid artery disease History of carotid artery disease status post bilateral carotid endarterectomies performed in 2008.  Tobacco abuse disorder History of remote tobacco abuse having stopped with the aid of Chantix summer of 2015.      Lorretta Harp MD FACP,FACC,FAHA, [facility] 12/09/2015 9:27 AM

## 2015-12-09 NOTE — Assessment & Plan Note (Signed)
History of carotid artery disease status post bilateral carotid endarterectomies performed in 2008.

## 2015-12-09 NOTE — Assessment & Plan Note (Signed)
History of peripheral arterial disease status post bilateral iliac stenting by myself December 2001. He had an endoluminal stent graft for abdominal aortic aneurysm performed by Dr. Trula Slade 02/14/14. He has a known occluded right SFA and denies claudication. Follow-up Dopplers recently performed 11/05/15 revealed a right ABI 0.55 and left of 0.7. His iliac stents remain patent. He denies claudication.

## 2015-12-09 NOTE — Patient Instructions (Signed)
Medication Instructions:  Your physician recommends that you continue on your current medications as directed. Please refer to the Current Medication list given to you today.   Labwork: Your physician recommends that you return for lab work in: [place] The lab can be found on the FIRST FLOOR of out building in [address]   Testing/Procedures: none  Follow-Up: Your physician wants you to follow-up in: 12 months with Dr. Gwenlyn Found. You will receive a reminder letter in the mail two months in advance. If you don't receive a letter, please call our office to schedule the follow-up appointment.   Any Other Special Instructions Will Be Listed Below (If Applicable).     If you need a refill on your cardiac medications before your next appointment, please call your pharmacy.

## 2015-12-09 NOTE — Assessment & Plan Note (Signed)
History of remote tobacco abuse having stopped with the aid of Chantix summer of 2015.

## 2015-12-09 NOTE — Assessment & Plan Note (Signed)
History of hyperlipidemia on atorvastatin. We will recheck a lipid and liver profile 

## 2015-12-09 NOTE — Assessment & Plan Note (Signed)
History of coronary artery disease status post bypass grafting in 2001 with a LIMA to his LAD, vein to obtuse marginal branch and to the RCA. A Myoview stress test performed prior to his endoluminal stent graft revealed a large inferolateral scar with ejection fraction of 30%. He denies chest pain or shortness of breath.

## 2015-12-18 DIAGNOSIS — Z720 Tobacco use: Secondary | ICD-10-CM | POA: Diagnosis not present

## 2015-12-18 DIAGNOSIS — I779 Disorder of arteries and arterioles, unspecified: Secondary | ICD-10-CM | POA: Diagnosis not present

## 2015-12-18 DIAGNOSIS — I2510 Atherosclerotic heart disease of native coronary artery without angina pectoris: Secondary | ICD-10-CM | POA: Diagnosis not present

## 2015-12-19 LAB — HEPATIC FUNCTION PANEL
ALBUMIN: 3.6 g/dL (ref 3.6–5.1)
ALK PHOS: 111 U/L (ref 40–115)
ALT: 32 U/L (ref 9–46)
AST: 26 U/L (ref 10–35)
BILIRUBIN TOTAL: 0.6 mg/dL (ref 0.2–1.2)
Bilirubin, Direct: 0.1 mg/dL (ref <=0.2)
Indirect Bilirubin: 0.5 mg/dL (ref 0.2–1.2)
Total Protein: 5.9 g/dL — ABNORMAL LOW (ref 6.1–8.1)

## 2015-12-19 LAB — LIPID PANEL
CHOL/HDL RATIO: 2.9 ratio (ref <=5.0)
Cholesterol: 163 mg/dL (ref 125–200)
HDL: 56 mg/dL (ref >=40)
LDL Cholesterol: 92 mg/dL (ref <130)
TRIGLYCERIDES: 75 mg/dL (ref <150)
VLDL: 15 mg/dL (ref <30)

## 2015-12-26 ENCOUNTER — Telehealth: Payer: Self-pay | Admitting: Cardiovascular Disease

## 2015-12-26 NOTE — Telephone Encounter (Signed)
New Message  Pt returning RN phone call- lab results. Please call back and discuss.

## 2015-12-26 NOTE — Telephone Encounter (Signed)
Spoke to patient. LABS Result given . Verbalized understanding  

## 2015-12-31 ENCOUNTER — Other Ambulatory Visit: Payer: Self-pay | Admitting: Cardiovascular Disease

## 2015-12-31 NOTE — Telephone Encounter (Signed)
Rx(s) sent to pharmacy electronically.  

## 2016-01-01 ENCOUNTER — Other Ambulatory Visit (HOSPITAL_COMMUNITY): Payer: Self-pay | Admitting: Cardiovascular Disease

## 2016-01-02 NOTE — Telephone Encounter (Signed)
Rx(s) sent to pharmacy electronically.  

## 2016-01-07 DIAGNOSIS — Z8546 Personal history of malignant neoplasm of prostate: Secondary | ICD-10-CM | POA: Diagnosis not present

## 2016-01-13 DIAGNOSIS — Z8546 Personal history of malignant neoplasm of prostate: Secondary | ICD-10-CM | POA: Diagnosis not present

## 2016-01-13 DIAGNOSIS — Z0000 Encounter for general adult medical examination without abnormal findings: Secondary | ICD-10-CM | POA: Diagnosis not present

## 2016-01-21 DIAGNOSIS — R079 Chest pain, unspecified: Secondary | ICD-10-CM | POA: Diagnosis not present

## 2016-01-21 DIAGNOSIS — R05 Cough: Secondary | ICD-10-CM | POA: Diagnosis not present

## 2016-02-10 ENCOUNTER — Telehealth: Payer: Self-pay | Admitting: Cardiovascular Disease

## 2016-02-10 DIAGNOSIS — E785 Hyperlipidemia, unspecified: Secondary | ICD-10-CM

## 2016-02-10 NOTE — Telephone Encounter (Signed)
Pt contacted, knows to stop taking the niacin and continue Lipitor. Pt will get updated lab work in June 2017. Pt mailed lab slips with not to go fasting to lab. Pt going to [place] lab. Pt home address verified, no additional questions at this time.

## 2016-02-10 NOTE — Telephone Encounter (Signed)
Jim [name] is calling to speak to someone about his medication Nyspan .Marland Kitchen Please call   Thanks

## 2016-02-10 NOTE — Telephone Encounter (Signed)
Pt Niacin 1000mg  cost has increased to $100 for 30 supply.  Pt would like to know options for changing this medication.  Told him would forward to PharmD and get back with him if there any suggestions.  Pt verbalized understanding, no additional questions.

## 2016-02-10 NOTE — Telephone Encounter (Signed)
Based on previous labs, would just suggest he stop niacin and repeat labs in 3 months.  Continue with atorvastatin.

## 2016-04-12 DIAGNOSIS — J40 Bronchitis, not specified as acute or chronic: Secondary | ICD-10-CM | POA: Diagnosis not present

## 2016-04-12 DIAGNOSIS — J329 Chronic sinusitis, unspecified: Secondary | ICD-10-CM | POA: Diagnosis not present

## 2016-04-14 ENCOUNTER — Other Ambulatory Visit: Payer: Self-pay

## 2016-04-14 ENCOUNTER — Other Ambulatory Visit: Payer: Self-pay | Admitting: Cardiovascular Disease

## 2016-04-14 MED ORDER — ATORVASTATIN CALCIUM 80 MG PO TABS: ORAL_TABLET | ORAL | Status: DC

## 2016-04-22 ENCOUNTER — Other Ambulatory Visit: Payer: Self-pay | Admitting: Cardiovascular Disease

## 2016-04-22 NOTE — Telephone Encounter (Signed)
Rx(s) sent to pharmacy electronically.  

## 2016-06-02 DIAGNOSIS — B353 Tinea pedis: Secondary | ICD-10-CM | POA: Diagnosis not present

## 2016-06-02 DIAGNOSIS — B351 Tinea unguium: Secondary | ICD-10-CM | POA: Diagnosis not present

## 2016-06-02 DIAGNOSIS — Z0000 Encounter for general adult medical examination without abnormal findings: Secondary | ICD-10-CM | POA: Diagnosis not present

## 2016-06-25 ENCOUNTER — Encounter: Payer: Self-pay | Admitting: Internal Medicine

## 2016-07-21 ENCOUNTER — Other Ambulatory Visit: Payer: Self-pay | Admitting: Pharmacist

## 2016-07-21 NOTE — Patient Outreach (Signed)
Outreach call to Jim [name] regarding his request for follow up from the [facility] Medication Adherence Campaign. Left a HIPAA compliant message on the patient's voicemail.  Harlow Asa, PharmD Clinical Pharmacist [facility] [phone]

## 2016-09-17 DIAGNOSIS — J209 Acute bronchitis, unspecified: Secondary | ICD-10-CM | POA: Diagnosis not present

## 2016-10-06 ENCOUNTER — Encounter: Payer: Self-pay | Admitting: Family

## 2016-10-11 ENCOUNTER — Ambulatory Visit (INDEPENDENT_AMBULATORY_CARE_PROVIDER_SITE_OTHER): Payer: Medicare Other | Admitting: Family

## 2016-10-11 ENCOUNTER — Encounter: Payer: Self-pay | Admitting: Family

## 2016-10-11 ENCOUNTER — Ambulatory Visit (HOSPITAL_COMMUNITY)
Admission: RE | Admit: 2016-10-11 | Discharge: 2016-10-11 | Disposition: A | Discharge status: Home / Self Care | Payer: Medicare Other | Source: Ambulatory Visit | Attending: Family | Admitting: Family

## 2016-10-11 VITALS — BP 142/84 | HR 85 | Temp 97.1°F | Resp 18 | Ht 73.0 in | Wt 180.4 lb

## 2016-10-11 DIAGNOSIS — I714 Abdominal aortic aneurysm, without rupture, unspecified: Secondary | ICD-10-CM

## 2016-10-11 DIAGNOSIS — Z48812 Encounter for surgical aftercare following surgery on the circulatory system: Secondary | ICD-10-CM | POA: Diagnosis not present

## 2016-10-11 DIAGNOSIS — Z95828 Presence of other vascular implants and grafts: Secondary | ICD-10-CM | POA: Diagnosis not present

## 2016-10-11 DIAGNOSIS — Z87891 Personal history of nicotine dependence: Secondary | ICD-10-CM | POA: Diagnosis not present

## 2016-10-11 NOTE — Patient Instructions (Signed)
Before your next abdominal ultrasound:  Take two Extra-Strength Gas-X capsules at bedtime the night before the test. Take another two Extra-Strength Gas-X capsules 3 hours before the test.   

## 2016-10-11 NOTE — Progress Notes (Signed)
[facility] [place]  CC: Follow up s/p EVAR  History of Present Illness  Jim [name] is a 70 y.o. (December 20, 1945) male patient of Dr. Trula Slade is back today for followup. On 02/14/2014, he underwent endovascular repair of abdominal aortic aneurysm. Of note the patient had a left external iliac stent and a right common iliac stent which had to be negotiated. His postoperative course was uncomplicated and he was discharged home on postoperative day #1. At his initial CT scan, the aneurysm had decreased from 5.5 cm down to 5.3 cm. There was no evidence of endoleak. He has no complaints today.  He takes a statin, ASA, and Plavix daily.   Pt Diabetic: No Pt smoker: former smoker, quit September 2016   Past Medical History:  Diagnosis Date  . AAA (abdominal aortic aneurysm) (HCC)    Infrarenal abdominal aortic aneurysm of 48 mm, ct 5/12, followed by Dr. Quay Burow.  Marland Kitchen CAD (coronary artery disease)        . Carotid artery disease ([place])   . History of shingles   . History of stress test 10/27/10   showed inferior posterior scar without ischemia, EF was 31% according to the myoview  . Hx of echocardiogram 01/26/2010   showed EF of 45% to 50% with basal and mid inferior/inferoseptal hypokinesia.  Marland Kitchen Hx of radiation therapy 01/13/11-03/09/11   prostate  . Hyperlipidemia   . Hypertension   . Myocardial infarction   . Neuromuscular disorder ([place]) 2002  . Prostate cancer ([place]) 2012   XRT, seed implants  . PVD (peripheral vascular disease) ([place])    Known iliac and SFA disease as well as a moderately sized abdominal aortic aneurism.  . S/P abdominal aortic aneurysm repair    performed by Dr. Trula Slade on 02/14/14 with a exclude or  . Tobacco abuse    Past Surgical History:  Procedure Laterality Date  . ABDOMINAL AORTAGRAM N/A 01/29/2014   Procedure: ABDOMINAL Maxcine Ham;  Surgeon: Angelia Mould, MD;  Location: [facility] CATH LAB;  Service: Cardiovascular;   Laterality: N/A;  . ABDOMINAL AORTIC ENDOVASCULAR STENT GRAFT Bilateral 02/14/2014   Procedure: ABDOMINAL AORTIC ENDOVASCULAR STENT GRAFT- GORE;  Surgeon: Serafina Mitchell, MD;  Location: [facility] OR;  Service: Vascular;  Laterality: Bilateral;  . bilateral lower extremity stents     right common femoral and left external iliac  . CAROTID ENDARTERECTOMY Bilateral    bilateral, right in 2009 and left in 2001  . CHOLECYSTECTOMY    . COLONOSCOPY  08/09/2011   Procedure: COLONOSCOPY;  Surgeon: Daneil Dolin, MD;  Location: [facility] END[facility];  Service: Endoscopy;  Laterality: N/A;  9:15  . CORONARY ARTERY BYPASS GRAFT  2001   with a LIMA to his LAD, vein to an obtuse marginal branch and to the RCA  . HERNIA REPAIR     periumbilical  . INGUINAL HERNIA REPAIR     bilateral   Social History Social History  Substance Use Topics  . Smoking status: Former Smoker    Packs/day: 1.00    Years: 15.00    Types: Cigarettes    Quit date: 11/23/2003  . Smokeless tobacco: Never Used     Comment: quit 10 years ago  . Alcohol use Yes     Comment: occasional   Family History Family History  Problem Relation Age of Onset  . Heart attack Father   . Prostate cancer Father     mets to bone  . Cancer Father   .  Heart disease Father   . Cancer Mother     ?   Current Outpatient Prescriptions on File Prior to Visit  Medication Sig Dispense Refill  . aspirin 81 MG tablet Take 81 mg by mouth daily.      Marland Kitchen atorvastatin (LIPITOR) 80 MG tablet TAKE 1 TABLET (80 MG TOTAL) BY MOUTH DAILY. 90 tablet 3  . calcium carbonate (TUMS - DOSED IN MG ELEMENTAL CALCIUM) 500 MG chewable tablet Chew 1,000 mg by mouth daily.      . clopidogrel (PLAVIX) 75 MG tablet TAKE 1 TABLET (75 MG TOTAL) BY MOUTH DAILY. 90 tablet 3  . folic acid (FOLVITE) Q000111Q MCG tablet Take 400 mcg by mouth daily.      . metoprolol tartrate (LOPRESSOR) 25 MG tablet TAKE 12.5MG  (1/2 TABLET) BY MOUTH IN THE MORNING AND 25MG  (1 TABLET) AT BEDTIME. 135 tablet 2  .  Multiple Vitamin (MULTIVITAMIN) capsule Take 1 capsule by mouth daily.      . ramipril (ALTACE) 2.5 MG capsule TAKE 1 CAPSULE (2.5 MG TOTAL) BY MOUTH DAILY. 90 capsule 3   No current facility-administered medications on file prior to visit.    No Known Allergies   ROS: See HPI for pertinent positives and negatives.  Physical Examination  Vitals:   10/11/16 0827  BP: (!) 143/82  Pulse: 85  Resp: 18  Temp: 97.1 F (36.2 C)  TempSrc: Oral  SpO2: 98%  Weight: 180 lb 6.4 oz (81.8 kg)  Height: 6\' 1"  (1.854 m)   Body mass index is 23.8 kg/m.  General: The patient appears their stated age. HEENT: No gross abnormalities Pulmonary: Non labored breathing Abdomen: Soft and non-tender Musculoskeletal: There are no major deformities. Neurologic: No focal weakness or paresthesias are detected. Skin: There are no ulcers or rashes noted. Psychiatric: The patient has normal affect. Cardiovascular: There is a regular rate and rhythm without significant murmur appreciated. Pedal pulses are not palpable, bilateral femoral pulses are 1+ palpable.   Non-Invasive Vascular Imaging  EVAR Duplex (Date: 10-11-16)  AAA sac size: 4.0 cm x 4.0 cm, Right CIA: 1.4 cm; Left CIA: 1.7 cm  no endoleak detected  10-06-15: 4.74 cm x 5.13 cm   Medical Decision Making  Jim [name] is a 70 y.o. male s/p EVAR (Date: 02/14/14).  Pt is asymptomatic with decreased sac size, based on limited visualization due to overlying bowel gas.  I discussed with the patient the importance of surveillance of the endograft.  The next endograft duplex will be scheduled for 12 months.  The patient will follow up with [place] in 12 months with these studies.           The patient will continue to get surveillance of his carotid and lower extremity arteries by Dr. Gwenlyn Found.   I emphasized the importance of maximal medical management including strict control of blood pressure, blood glucose, and lipid levels,  antiplatelet agents, obtaining regular exercise, and cessation of smoking.   Thank you for allowing [place] to participate in this patient's care.  Clemon Chambers, RN, MSN, FNP-C [facility] [place] Office: [phone]  Clinic Physician: Donzetta Matters on call  10/11/2016, 8:28 AM

## 2016-10-12 NOTE — Addendum Note (Signed)
Addended by: Lianne Cure A on: 10/12/2016 08:30 AM   Modules accepted: Orders

## 2016-12-13 ENCOUNTER — Telehealth: Payer: Self-pay | Admitting: Cardiovascular Disease

## 2016-12-13 NOTE — Telephone Encounter (Signed)
Requesting surgical clearance:  1. Type of surgery: Colonoscopy  2.Surgical Date:  12/29/16  3. Medications that need to be held: Plavix and ASA 81--requesting 4 days prior to procedure   4. CAD: Yes  5. I will defer to:  Dr. Pearla Dubonnet Information:   [facility] Phone:  [phone] Fax:  [phone]

## 2016-12-14 ENCOUNTER — Ambulatory Visit: Payer: Medicare Other | Admitting: Cardiovascular Disease

## 2016-12-14 NOTE — Telephone Encounter (Signed)
Routed to number provided via EPIC. 

## 2016-12-14 NOTE — Telephone Encounter (Signed)
Cleared for colonoscopy. Okay to hold antiplatelet agents

## 2017-01-11 ENCOUNTER — Ambulatory Visit: Payer: Medicare Other | Admitting: Cardiovascular Disease

## 2017-02-07 ENCOUNTER — Other Ambulatory Visit (HOSPITAL_COMMUNITY): Payer: Self-pay | Admitting: Cardiovascular Disease

## 2017-06-13 ENCOUNTER — Other Ambulatory Visit: Payer: Self-pay | Admitting: Cardiovascular Disease

## 2017-10-17 ENCOUNTER — Ambulatory Visit (HOSPITAL_COMMUNITY)
Admission: RE | Admit: 2017-10-17 | Discharge: 2017-10-17 | Disposition: A | Discharge status: Home / Self Care | Payer: Medicare Other | Source: Ambulatory Visit | Attending: Family | Admitting: Family

## 2017-10-17 ENCOUNTER — Encounter: Payer: Self-pay | Admitting: Family

## 2017-10-17 ENCOUNTER — Ambulatory Visit (INDEPENDENT_AMBULATORY_CARE_PROVIDER_SITE_OTHER): Payer: Medicare Other | Admitting: Family

## 2017-10-17 VITALS — BP 122/74 | HR 68 | Temp 97.6°F | Resp 18 | Wt 172.0 lb

## 2017-10-17 DIAGNOSIS — I714 Abdominal aortic aneurysm, without rupture, unspecified: Secondary | ICD-10-CM

## 2017-10-17 DIAGNOSIS — Z87891 Personal history of nicotine dependence: Secondary | ICD-10-CM | POA: Insufficient documentation

## 2017-10-17 DIAGNOSIS — Z95828 Presence of other vascular implants and grafts: Secondary | ICD-10-CM | POA: Diagnosis not present

## 2017-10-17 DIAGNOSIS — I70213 Atherosclerosis of native arteries of extremities with intermittent claudication, bilateral legs: Secondary | ICD-10-CM

## 2017-10-17 NOTE — Patient Instructions (Addendum)
Before your next abdominal ultrasound:  Take two Extra-Strength Gas-X capsules at bedtime the night before the test. Take another two Extra-Strength Gas-X capsules 3 hours before the test.  Avoid gas forming foods the day before the test.       Peripheral Vascular Disease Peripheral vascular disease (PVD) is a disease of the blood vessels that are not part of your heart and brain. A simple term for PVD is poor circulation. In most cases, PVD narrows the blood vessels that carry blood from your heart to the rest of your body. This can result in a decreased supply of blood to your arms, legs, and internal organs, like your stomach or kidneys. However, it most often affects a person's lower legs and feet. There are two types of PVD.  Organic PVD. This is the more common type. It is caused by damage to the structure of blood vessels.  Functional PVD. This is caused by conditions that make blood vessels contract and tighten (spasm).  Without treatment, PVD tends to get worse over time. PVD can also lead to acute ischemic limb. This is when an arm or limb suddenly has trouble getting enough blood. This is a medical emergency. Follow these instructions at home:  Take medicines only as told by your doctor.  Do not use any tobacco products, including cigarettes, chewing tobacco, or electronic cigarettes. If you need help quitting, ask your doctor.  Lose weight if you are overweight, and maintain a healthy weight as told by your doctor.  Eat a diet that is low in fat and cholesterol. If you need help, ask your doctor.  Exercise regularly. Ask your doctor for some good activities for you.  Take good care of your feet. ? Wear comfortable shoes that fit well. ? Check your feet often for any cuts or sores. Contact a doctor if:  You have cramps in your legs while walking.  You have leg pain when you are at rest.  You have coldness in a leg or foot.  Your skin changes.  You are unable to  get or have an erection (erectile dysfunction).  You have cuts or sores on your feet that are not healing. Get help right away if:  Your arm or leg turns cold and blue.  Your arms or legs become red, warm, swollen, painful, or numb.  You have chest pain or trouble breathing.  You suddenly have weakness in your face, arm, or leg.  You become very confused or you cannot speak.  You suddenly have a very bad headache.  You suddenly cannot see. This information is not intended to replace advice given to you by your health care provider. Make sure you discuss any questions you have with your health care provider. Document Released: 02/02/2010 Document Revised: 04/15/2016 Document Reviewed: 04/18/2014 Elsevier Interactive Patient Education  2017 [employer]  

## 2017-10-17 NOTE — Progress Notes (Addendum)
[facility] [place]  CC: Follow up s/p Endovascular Repair of Abdominal Aortic Aneurysm    History of Present Illness  Jim [name] is a 71 y.o. (09/21/1946) male is back today for followup. On 02/14/2014, he underwent endovascular repair of abdominal aortic aneurysm by Dr. Trula Slade. Of note the patient had a left external iliac stent and a right common iliac stent which had to be negotiated. His postoperative course was uncomplicated and he was discharged home on postoperative day #1. At his initial CT scan, the aneurysm had decreased from 5.5 cm down to 5.3 cm. There was no evidence of endoleak. He has no complaints today.  An IVC filter was placed in February 2018, he was in transient atrial fib.   He states his lungs are being evaluated, has been somewhat short of breath lately.  He reports known lack of perfusion in both legs, both calves feel tight after walking 50-60 yards, relieved by rest, no non healing wounds in his feet or legs.  In 2001 he had stenting of right CIA and left EIA by Dr. Gwenlyn Found. Last ABI was in 2016, see below for results. He also had a CABG in 2001.  His current cardiologist is Dr. Tomie China with [facility].   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   He takes a statin, ASA, and Plavix daily.   Pt Diabetic: No Pt smoker: former smoker, quit September 2016   Past Medical History:  Diagnosis Date  . AAA (abdominal aortic aneurysm) (HCC)    Infrarenal abdominal aortic aneurysm of 48 mm, ct 5/12, followed by Dr. Quay Burow.  Marland Kitchen CAD (coronary artery disease)        . Carotid artery disease ([place])   . History of shingles   . History of stress test 10/27/10   showed inferior posterior scar without ischemia, EF was 31% according to the myoview  . Hx of echocardiogram 01/26/2010   showed EF of 45% to 50% with basal and mid inferior/inferoseptal hypokinesia.  Marland Kitchen Hx of  radiation therapy 01/13/11-03/09/11   prostate  . Hyperlipidemia   . Hypertension   . Myocardial infarction ([place])   . Neuromuscular disorder ([place]) 2002  . Prostate cancer ([place]) 2012   XRT, seed implants  . PVD (peripheral vascular disease) ([place])    Known iliac and SFA disease as well as a moderately sized abdominal aortic aneurism.  . S/P abdominal aortic aneurysm repair    performed by Dr. Trula Slade on 02/14/14 with a exclude or  . Tobacco abuse    Past Surgical History:  Procedure Laterality Date  . ABDOMINAL AORTAGRAM N/A 01/29/2014   Procedure: ABDOMINAL Maxcine Ham;  Surgeon: Angelia Mould, MD;  Location[facility] CATH LAB;  Service: Cardiovascular;  Laterality: N/A;  . ABDOMINAL AORTIC ENDOVASCULAR STENT GRAFT Bilateral 02/14/2014   Procedure: ABDOMINAL AORTIC ENDOVASCULAR STENT GRAFT- GORE;  Surgeon: Serafina Mitchell, MD;  Location: [place] OR;  Service: Vascular;  Laterality: Bilateral;  . bilateral lower extremity stents     right common femoral and left external iliac  . CAROTID ENDARTERECTOMY Bilateral    bilateral, right in 2009 and left in 2001  . CHOLECYSTECTOMY    . COLONOSCOPY  08/09/2011   Procedure: COLONOSCOPY;  Surgeon: Daneil Dolin, MD;  [facility];  Service: Endoscopy;  Laterality: N/A;  9:15  . CORONARY ARTERY BYPASS GRAFT  2001   with a LIMA to his LAD, vein  to an obtuse marginal branch and to the RCA  . HERNIA REPAIR     periumbilical  . INGUINAL HERNIA REPAIR     bilateral   Social History Social History   Tobacco Use  . Smoking status: Former Smoker    Packs/day: 1.00    Years: 15.00    Pack years: 15.00    Types: Cigarettes    Last attempt to quit: 11/23/2003    Years since quitting: 13.9  . Smokeless tobacco: Never Used  . Tobacco comment: quit 10 years ago  Substance Use Topics  . Alcohol use: Yes    Comment: occasional  . Drug use: No   Family History Family History  Problem Relation Age of Onset  . Heart attack Father   . Prostate  cancer Father        mets to bone  . Cancer Father   . Heart disease Father   . Cancer Mother        ?   Current Outpatient Medications on File Prior to Visit  Medication Sig Dispense Refill  . aspirin 81 MG tablet Take 81 mg by mouth daily.      Marland Kitchen atorvastatin (LIPITOR) 80 MG tablet TAKE 1 TABLET (80 MG TOTAL) BY MOUTH DAILY. 90 tablet 3  . calcium carbonate (TUMS - DOSED IN MG ELEMENTAL CALCIUM) 500 MG chewable tablet Chew 1,000 mg by mouth daily.      . clopidogrel (PLAVIX) 75 MG tablet TAKE 1 TABLET (75 MG TOTAL) BY MOUTH DAILY. 90 tablet 3  . FLUZONE HIGH-DOSE 0.5 ML injection TO BE ADMINISTERED BY PHARMACIST FOR IMMUNIZATION  0  . folic acid (FOLVITE) 062 MCG tablet Take 400 mcg by mouth daily.      . metoprolol tartrate (LOPRESSOR) 25 MG tablet TAKE 12.5MG  (1/2 TABLET) BY MOUTH IN THE MORNING AND 25MG  (1 TABLET) AT BEDTIME. 135 tablet 2  . Multiple Vitamin (MULTIVITAMIN) capsule Take 1 capsule by mouth daily.      . ramipril (ALTACE) 2.5 MG capsule TAKE 1 CAPSULE (2.5 MG TOTAL) BY MOUTH DAILY. 90 capsule 3   No current facility-administered medications on file prior to visit.    No Known Allergies   ROS: See HPI for pertinent positives and negatives.  Physical Examination  Vitals:   10/17/17 0844 10/17/17 0845  BP: 119/72 122/74  Pulse: 68   Resp: 18   Temp: 97.6 F (36.4 C)   TempSrc: Oral   SpO2: 96%   Weight: 172 lb (78 kg)    Body mass index is 22.69 kg/m.  General: The patient appears their stated age, slim male. HEENT: No gross abnormalities Pulmonary: Non labored breathing, CTAB, adequate air movement in all fields.  Abdomen: Soft and non-tender with normal pitched bowel sounds.  Musculoskeletal:There are no major deformities. Neurologic:No focal weakness or paresthesias are detected. Skin:There are no ulcers or rashes noted. Psychiatric:The patient has normal affect. Cardiovascular:There is a regular rate and rhythm without significant murmur  appreciated. AICD is palpable left upper chest. Pedal pulses are not palpable, bilateral femoral pulses are 1+ palpable.    DATA  EVAR Duplex (Date: 10/17/17):  AAA sac size: 4.1cm; Bilateral CIA not visualized due to overlying bowel gas, limited visualization.   no endoleak detected Previous: (Date: 10-11-16)  AAA sac size: 4.0 cm x 4.0 cm, Right CIA: 1.4 cm; Left CIA: 1.7 cm   ABI (Date: 11-05-15 at [facility]):  R:   ABI: 0.55 (was 0.57 on 11-01-14 at [facility]),   PT:  mono  DP: mono  TBI:  0.38  L:   ABI: 0.70 (was 0.75),   PT: mono  DP: mono  TBI: 0.35  Right lower extremity with severe arterial occlusive disease, left with moderate disease.   Bilateral LE Arterial Duplex (11-01-14 at [facility]): Right CIA stent with no stenosis. Left EIA stent not visualized. Right SFA with near occlusion, right tibial trunk vessels with near occlusions.      Medical Decision Making  ROHIL LESCH is a 71 y.o. male who presents s/p EVAR (Date: 02/14/2014).  Pt is asymptomatic with stable sac size, based on limited visualization.  I discussed with the patient the importance of surveillance of the endograft.       In 2001 he had stenting of right CIA and left EIA by Dr. Gwenlyn Found. He has claudication in both calves after walking 50-60 yards, relieved by rest.        Graduated walking program discussed and how to achieve.    The next endograft duplex will be scheduled for 12 months, will also include ABI's.   The patient will follow up with [place] in 12 months with these studies. I advised him to notify us if the pain in his calve with walking worsens, we will see him sooner if this occurs.   I emphasized the importance of maximal medical management including strict control of blood pressure, blood glucose, and lipid levels, antiplatelet agents, obtaining regular exercise, and cessation of smoking.   Thank you for allowing [place] to participate in this patient's  care.  Clemon Chambers, RN, MSN, FNP-C [facility] [place] Office: [place]: Trula Slade  10/17/2017, 8:59 AM

## 2017-10-19 NOTE — Addendum Note (Signed)
Addended by: Lianne Cure A on: 10/19/2017 09:39 AM   Modules accepted: Orders

## 2018-04-04 ENCOUNTER — Other Ambulatory Visit: Payer: Self-pay | Admitting: Cardiovascular Disease

## 2018-04-04 NOTE — Telephone Encounter (Signed)
Rx sent to pharmacy   

## 2018-04-12 ENCOUNTER — Other Ambulatory Visit: Payer: Self-pay | Admitting: Cardiovascular Disease

## 2018-11-07 ENCOUNTER — Encounter: Payer: Self-pay | Admitting: Family

## 2018-11-07 ENCOUNTER — Ambulatory Visit (HOSPITAL_COMMUNITY)
Admission: RE | Admit: 2018-11-07 | Discharge: 2018-11-07 | Disposition: A | Discharge status: Home / Self Care | Payer: Medicare Other | Source: Ambulatory Visit | Attending: Family | Admitting: Family

## 2018-11-07 ENCOUNTER — Ambulatory Visit: Payer: Medicare Other | Admitting: Family

## 2018-11-07 ENCOUNTER — Ambulatory Visit (INDEPENDENT_AMBULATORY_CARE_PROVIDER_SITE_OTHER)
Admission: RE | Admit: 2018-11-07 | Discharge: 2018-11-07 | Disposition: A | Discharge status: Home / Self Care | Payer: Medicare Other | Source: Ambulatory Visit | Attending: Family | Admitting: Family

## 2018-11-07 VITALS — BP 129/70 | HR 76 | Temp 97.0°F | Resp 14 | Ht 73.0 in | Wt 175.0 lb

## 2018-11-07 DIAGNOSIS — I714 Abdominal aortic aneurysm, without rupture, unspecified: Secondary | ICD-10-CM

## 2018-11-07 DIAGNOSIS — I70213 Atherosclerosis of native arteries of extremities with intermittent claudication, bilateral legs: Secondary | ICD-10-CM | POA: Diagnosis not present

## 2018-11-07 DIAGNOSIS — Z95828 Presence of other vascular implants and grafts: Secondary | ICD-10-CM

## 2018-11-07 DIAGNOSIS — Z87891 Personal history of nicotine dependence: Secondary | ICD-10-CM | POA: Diagnosis not present

## 2018-11-07 NOTE — Progress Notes (Signed)
[facility] [place]  CC: Follow up s/p Endovascular Repair of Abdominal Aortic Aneurysm and peripheral artery occlusive disease     History of Present Illness  Jim [name] is a 72 y.o. ([birth year]) male who is s/p endovascular repair of abdominal aortic aneurysm on 02-14-14 by Dr. Trula Slade. Of note the patient had a left external iliac stent and a right common iliac stent which had to be negotiated. His postoperative course was uncomplicated and he was discharged home on postoperative day #1. At his initial CT scan, the aneurysm had decreased from 5.5 cm down to 5.3 cm. There was no evidence of endoleak. He has no complaints today.  An IVC filter was placed in February 2018, he was in transient atrial fib.   He states his lungs have been evaluated, has a place on the left upper lobe that is being monitored, was biopsied, pt states is not cancer.   He is walking about 30 minutes every other day.   He reports known lack of perfusion in both legs, both calves feel tight after walking 15-20 minutes, relieved by rest, no non healing wounds in his feet or legs.  In 2001 he had stenting of right CIA and left EIA by Dr. Gwenlyn [name]. He also had a CABG in 2001.  His cardiologist is Dr. Tomie [name] with [facility].   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   He takes a statin, ASA, and Plavix daily.  Diabetic: No Tobacco use: former smoker, quit September 2016   Past Medical History:  Diagnosis Date  . AAA (abdominal aortic aneurysm) (HCC)    Infrarenal abdominal aortic aneurysm of 48 mm, ct 5/12, followed by Dr. Quay Burow.  Jim [name] Kitchen CAD (coronary artery disease)        . Carotid artery disease ([place])   . History of shingles   . History of stress test 10/27/10   showed inferior posterior scar without ischemia, EF was 31% according to the myoview  . Hx of echocardiogram 01/26/2010   showed EF of 45% to 50%  with basal and mid inferior/inferoseptal hypokinesia.  Jim [name] Kitchen Hx of radiation therapy 01/13/11-03/09/11   prostate  . Hyperlipidemia   . Hypertension   . Myocardial infarction ([place])   . Neuromuscular disorder ([place]) 2002  . 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 PVD (peripheral vascular disease) ([place])    Known iliac and SFA disease as well as a moderately sized abdominal aortic aneurism.  . S/P abdominal aortic aneurysm repair    performed by Dr. Trula Slade on 02/14/14 with a exclude or  . Tobacco abuse    Past Surgical History:  Procedure Laterality Date  . ABDOMINAL AORTAGRAM N/A 01/29/2014   Procedure: ABDOMINAL Maxcine Ham;  Surgeon: Angelia Mould, MD;  Location: [facility] CATH LAB;  Service: Cardiovascular;  Laterality: N/A;  . ABDOMINAL AORTIC ENDOVASCULAR STENT GRAFT Bilateral 02/14/2014   Procedure: ABDOMINAL AORTIC ENDOVASCULAR STENT GRAFT- GORE;  Surgeon: Serafina Mitchell, MD;  Location: [place] OR;  Service: Vascular;  Laterality: Bilateral;  . bilateral lower extremity stents     right common femoral and left external iliac  . CAROTID ENDARTERECTOMY Bilateral    bilateral, right in 2009 and left in 2001  . CHOLECYSTECTOMY    . COLONOSCOPY  08/09/2011   Procedure: COLONOSCOPY;  Surgeon: Daneil Dolin, MD;  [facility];  Service: Endoscopy;  Laterality: N/A;  9:15  . CORONARY  ARTERY BYPASS GRAFT  2001   with a LIMA to his LAD, vein to an obtuse marginal branch and to the RCA  . HERNIA REPAIR     periumbilical  . INGUINAL HERNIA REPAIR     bilateral   Social History Social History   Tobacco Use  . Smoking status: Former Smoker    Packs/day: 1.00    Years: 15.00    Pack years: 15.00    Types: Cigarettes    Last attempt to quit: 11/23/2003    Years since quitting: 14.9  . Smokeless tobacco: Never Used  . Tobacco comment: quit 10 years ago  Substance Use Topics  . Alcohol use: Yes    Comment: occasional  . Drug use: No   Family History Family History   Problem Relation Age of Onset  . Heart attack Father   . Prostate cancer Father        mets to bone  . Cancer Father   . Heart disease Father   . Cancer Mother        ?   Current Outpatient Medications on File Prior to Visit  Medication Sig Dispense Refill  . aspirin 81 MG tablet Take 81 mg by mouth daily.      Jim [name] Kitchen atorvastatin (LIPITOR) 80 MG tablet TAKE 1 TABLET (80 MG TOTAL) BY MOUTH DAILY. 90 tablet 3  . calcium carbonate (TUMS - DOSED IN MG ELEMENTAL CALCIUM) 500 MG chewable tablet Chew 1,000 mg by mouth daily.      . clopidogrel (PLAVIX) 75 MG tablet TAKE 1 TABLET (75 MG TOTAL) BY MOUTH DAILY. 90 tablet 3  . FLUZONE HIGH-DOSE 0.5 ML injection TO BE ADMINISTERED BY PHARMACIST FOR IMMUNIZATION  0  . folic acid (FOLVITE) 160 MCG tablet Take 400 mcg by mouth daily.      . metoprolol tartrate (LOPRESSOR) 25 MG tablet TAKE 12.5MG  (1/2 TABLET) BY MOUTH IN THE MORNING AND 25MG  (1 TABLET) AT BEDTIME. 135 tablet 2  . Multiple Vitamin (MULTIVITAMIN) capsule Take 1 capsule by mouth daily.      Jim [name] Kitchen zolpidem (AMBIEN) 10 MG tablet TAKE 1 TABLET BY MOUTH AT BEDTIME AS NEEDED FOR SLEEP.     No current facility-administered medications on file prior to visit.    No Known Allergies   ROS: See HPI for pertinent positives and negatives.  Physical Examination  Vitals:   11/07/18 1054 11/07/18 1059  BP: 127/68 129/70  Pulse: 76   Resp: 14   Temp: (!) 97 F (36.1 C)   SpO2: 99%   Weight: 175 lb (79.4 kg)   Height: 6\' 1"  (1.854 m)    Body mass index is 23.09 kg/m.  General: The patient appears his stated age, slim male. HEENT: No gross abnormalities Pulmonary: Non labored breathing, CTAB, adequate air movement in all fields.  Abdomen: Soft and non-tender with normal pitched bowel sounds.  Musculoskeletal:There are no major deformities. Neurologic:No focal weakness or paresthesias are detected. Skin: No rashes, no ulcers, no cellulitis.   Neurologic: Pain and light touch intact in  extremities, Motor exam as listed above. Psychiatric: Normal thought content, mood appropriate for clinical situation.  Cardiovascular:There is a regular rate and rhythm without significant murmur appreciated. AICD is palpable left upper chest. Pedal pulses are not palpable, bilateral femoral pulses are 2+ palpable.    DATA  11-07-18 Endovascular Aortic Repair (EVAR):  +----------+----------------+-------------------+-------------------+       Diameter AP (cm)Diameter Trans (cm)Velocities (cm/sec)  +----------+----------------+-------------------+-------------------+  Aorta   4.14  4.14        38           +----------+----------------+-------------------+-------------------+  Right Limb1.68      1.91        45           +----------+----------------+-------------------+-------------------+  Left Limb 1.70      1.68        38           +----------+----------------+-------------------+-------------------+    Summary:  Abdominal Aorta: Patent endovascular aneurysm repair with no evidence of endoleak.   EVAR Duplex (Date: 10/17/17):  AAA sac size: 4.1cm; Bilateral CIA not visualized due to overlying bowel gas, limited visualization.   no endoleak detected Previous: (Date:10-11-16) AAA sac size:4.0cm x 4.0cm, Right CIA: 1.4 cm; Left CIA: 1.7 cm  11-07-18 ABI Findings:  +---------+------------------+-----+---------+---------------------------------+  Right  Rt Pressure (mmHg)IndexWaveform Comment               +---------+------------------+-----+---------+---------------------------------+  Brachial 146           triphasic                   +---------+------------------+-----+---------+---------------------------------+  PTA   66        0.45 biphasic                     +---------+------------------+-----+---------+---------------------------------+  DP    56        0.38 biphasic                    +---------+------------------+-----+---------+---------------------------------+  Great Toe0         0.00      unable to obtaindue to low                            amplitude vs absence         +---------+------------------+-----+---------+---------------------------------+    +---------+------------------+-----+----------+-------+  Left   Lt Pressure (mmHg)IndexWaveform Comment  +---------+------------------+-----+----------+-------+  Brachial 141           triphasic       +---------+------------------+-----+----------+-------+  PTA   79        0.54 biphasic       +---------+------------------+-----+----------+-------+  DP    85        0.58 monophasic      +---------+------------------+-----+----------+-------+  Great Toe94        0.64 Abnormal       +---------+------------------+-----+----------+-------+    +-------+-----------+-----------+------------+------------+  ABI/TBIToday's ABIToday's TBIPrevious ABIPrevious TBI  +-------+-----------+-----------+------------+------------+  Right 0.45    0     0.55    0.38      +-------+-----------+-----------+------------+------------+  Left  0.58    0.64    0.70    0.35      +-------+-----------+-----------+------------+------------+    Bilateral ABIs appear decreased compared to prior study on 2016. Right TBIs appear decreased compared to prior study on 2016. Left TBIs appear essentially unchanged.    Summary:  Right: Resting right ankle-brachial index indicates severe right lower extremity arterial disease. The right toe-brachial index is abnormal. RT great toe pressure = 0 mmHg.     Left: Resting left ankle-brachial index indicates moderate left lower extremity arterial disease. The left toe-brachial index is abnormal. LT Great toe pressure = 94 mmHg.         Bilateral LE Arterial Duplex (11-01-14 at [facility]): Right CIA stent with no stenosis. Left EIA stent not visualized. Right SFA with near occlusion, right tibial trunk  vessels with near occlusions.     Medical Decision Making  JAYVYN HASELTON is a 72 y.o. male who presents s/p EVAR (Date: 02/14/2014).  Pt is asymptomatic with stable sac size at 4.14 cm.  I discussed with the patient the importance of surveillance of the endograft.       In 2001 he had stenting of right CIA and left EIA by Dr. Gwenlyn [name]. He has claudication in both calves after walking 50-60 yards, relieved by rest.        Graduated walking program discussed and how to achieve.    The next endograft duplex will be scheduled for 12 months, will also include ABI's.   The patient will follow up with [place] in 12 months with these studies. I advised him to notify us if the pain in his calf with walking worsens, we will see him sooner if this occurs.   I emphasized the importance of maximal medical management including strict control of blood pressure, blood glucose, and lipid levels, antiplatelet agents, obtaining regular exercise, and cessation of smoking.   Thank you for allowing [place] to participate in this patient's care.  Clemon Chambers, RN, MSN, FNP-C [facility] [place] Office: [phone]  Clinic Physician: Carlis Abbott  11/07/2018, 11:41 AM

## 2018-11-07 NOTE — Patient Instructions (Signed)
Before your next abdominal ultrasound:  Avoid gas forming foods and beverages the day before the test.   Take two Extra-Strength Gas-X capsules at bedtime the night before the test. Take another two Extra-Strength Gas-X capsules in the middle of the night if you get up to the restroom, if not, first thing in the morning with water.  Do not chew gum.      Peripheral Vascular Disease Peripheral vascular disease (PVD) is a disease of the blood vessels that are not part of your heart and brain. A simple term for PVD is poor circulation. In most cases, PVD narrows the blood vessels that carry blood from your heart to the rest of your body. This can result in a decreased supply of blood to your arms, legs, and internal organs, like your stomach or kidneys. However, it most often affects a person's lower legs and feet. There are two types of PVD.  Organic PVD. This is the more common type. It is caused by damage to the structure of blood vessels.  Functional PVD. This is caused by conditions that make blood vessels contract and tighten (spasm).  Without treatment, PVD tends to get worse over time. PVD can also lead to acute ischemic limb. This is when an arm or limb suddenly has trouble getting enough blood. This is a medical emergency. Follow these instructions at home:  Take medicines only as told by your doctor.  Do not use any tobacco products, including cigarettes, chewing tobacco, or electronic cigarettes. If you need help quitting, ask your doctor.  Lose weight if you are overweight, and maintain a healthy weight as told by your doctor.  Eat a diet that is low in fat and cholesterol. If you need help, ask your doctor.  Exercise regularly. Ask your doctor for some good activities for you.  Take good care of your feet. ? Wear comfortable shoes that fit well. ? Check your feet often for any cuts or sores. Contact a doctor if:  You have cramps in your legs while walking.  You have  leg pain when you are at rest.  You have coldness in a leg or foot.  Your skin changes.  You are unable to get or have an erection (erectile dysfunction).  You have cuts or sores on your feet that are not healing. Get help right away if:  Your arm or leg turns cold and blue.  Your arms or legs become red, warm, swollen, painful, or numb.  You have chest pain or trouble breathing.  You suddenly have weakness in your face, arm, or leg.  You become very confused or you cannot speak.  You suddenly have a very bad headache.  You suddenly cannot see. This information is not intended to replace advice given to you by your health care provider. Make sure you discuss any questions you have with your health care provider. Document Released: 02/02/2010 Document Revised: 04/15/2016 Document Reviewed: 04/18/2014 Elsevier Interactive Patient Education  2017 [employer]  

## 2019-05-05 ENCOUNTER — Other Ambulatory Visit: Payer: Self-pay | Admitting: Cardiovascular Disease

## 2019-06-10 ENCOUNTER — Other Ambulatory Visit: Payer: Self-pay

## 2019-06-10 ENCOUNTER — Encounter: Payer: Self-pay | Admitting: *Deleted

## 2019-06-10 ENCOUNTER — Emergency Department (INDEPENDENT_AMBULATORY_CARE_PROVIDER_SITE_OTHER): Payer: Medicare Other

## 2019-06-10 ENCOUNTER — Emergency Department
Admission: EM | Admit: 2019-06-10 | Discharge: 2019-06-10 | Disposition: A | Discharge status: Home / Self Care | Payer: Medicare Other | Source: Home / Self Care

## 2019-06-10 DIAGNOSIS — M545 Low back pain, unspecified: Secondary | ICD-10-CM

## 2019-06-10 DIAGNOSIS — M5137 Other intervertebral disc degeneration, lumbosacral region: Secondary | ICD-10-CM

## 2019-06-10 DIAGNOSIS — S39012A Strain of muscle, fascia and tendon of lower back, initial encounter: Secondary | ICD-10-CM | POA: Diagnosis not present

## 2019-06-10 MED ORDER — HYDROCODONE-ACETAMINOPHEN 5-325 MG PO TABS: ORAL_TABLET | ORAL | 0 refills | Status: DC

## 2019-06-10 MED ORDER — METHOCARBAMOL 500 MG PO TABS: 500.0000 mg | ORAL_TABLET | Freq: Two times a day (BID) | ORAL | 0 refills | Status: DC

## 2019-06-10 NOTE — ED Provider Notes (Signed)
Jim [name] CARE    CSN: [id number] Arrival date & time: 06/10/19  1102     History   Chief Complaint Chief Complaint  Patient presents with  . Back Pain    HPI Jim [name] is a 73 y.o. male.   HPI 73 year old man who was lifting some boxes yesterday.  He only listed 5 or 6 boxes but when he was doing so he had a sudden acute severe low back pain.  It did not radiate down the legs, but does radiate laterally bilaterally.  His legs are fine.  The back is very tight.  He has trouble getting up off the commode or up out of a chair.  He has hurt his back once in the past.  No history of major back or disc disease.  The box was not severely heavy but moderately heavy.  No known history of osteoporosis.  He has had an AAA repair. Past Medical History:  Diagnosis Date  . AAA (abdominal aortic aneurysm) (HCC)    Infrarenal abdominal aortic aneurysm of 48 mm, ct 5/12, followed by Dr. Quay [name].  Marland Kitchen CAD (coronary artery disease)        . Carotid artery disease ([place])   . History of shingles   . History of stress test 10/27/10   showed inferior posterior scar without ischemia, EF was 31% according to the myoview  . Hx of echocardiogram 01/26/2010   showed EF of 45% to 50% with basal and mid inferior/inferoseptal hypokinesia.  Marland Kitchen Hx of radiation therapy 01/13/11-03/09/11   prostate  . Hyperlipidemia   . Hypertension   . Myocardial infarction ([place])   . Neuromuscular disorder ([place]) 2002  . Prostate cancer ([place]) 2012   XRT, seed implants  . PVD (peripheral vascular disease) ([place])    Known iliac and SFA disease as well as a moderately sized abdominal aortic aneurism.  . S/P abdominal aortic aneurysm repair    performed by Dr. Trula [name] on 02/14/14 with a exclude or  . Tobacco abuse     Patient Active Problem List   Diagnosis Date Noted  . AAA (abdominal aortic aneurysm) without rupture ([place]) 03/18/2014  . AAA (abdominal aortic aneurysm) ([place]) 02/14/2014  . Abdominal  aneurysm without mention of rupture 01/14/2014  . Coronary artery disease 12/18/2013  . Peripheral arterial occlusive disease ([place]) 12/18/2013  . Carotid artery disease ([place]) 12/18/2013  . Abdominal aortic aneurysm ([place]) 12/18/2013  . Tobacco abuse disorder 12/18/2013  . Hyperlipidemia 12/18/2013  . Hx of radiation therapy   . Prostate cancer ([place]) 10/11/2011  . Hx of adenomatous colonic polyps 07/19/2011    Past Surgical History:  Procedure Laterality Date  . ABDOMINAL AORTAGRAM N/A 01/29/2014   Procedure: ABDOMINAL Jim [name];  Surgeon: Angelia Mould, MD;  Location: [facility] CATH LAB;  Service: Cardiovascular;  Laterality: N/A;  . ABDOMINAL AORTIC ENDOVASCULAR STENT GRAFT Bilateral 02/14/2014   Procedure: ABDOMINAL AORTIC ENDOVASCULAR STENT GRAFT- GORE;  Surgeon: Jim Mitchell, MD;  Location: [place] OR;  Service: Vascular;  Laterality: Bilateral;  . bilateral lower extremity stents     right common femoral and left external iliac  . CAROTID ENDARTERECTOMY Bilateral    bilateral, right in 2009 and left in 2001  . CHOLECYSTECTOMY    . COLONOSCOPY  08/09/2011   Procedure: COLONOSCOPY;  Surgeon: Jim Dolin, MD;  [facility];  Service: Endoscopy;  Laterality: N/A;  9:15  . CORONARY ARTERY BYPASS GRAFT  2001   with a LIMA to his  LAD, vein to an obtuse marginal branch and to the RCA  . HERNIA REPAIR     periumbilical  . INGUINAL HERNIA REPAIR     bilateral       Home Medications    Prior to Admission medications   Medication Sig Start Date End Date Taking? Authorizing Provider  aspirin 81 MG tablet Take 81 mg by mouth daily.      [provider]  atorvastatin (LIPITOR) 80 MG tablet TAKE 1 TABLET (80 MG TOTAL) BY MOUTH DAILY. 06/13/17   Jim Harp, MD  calcium carbonate (TUMS - DOSED IN MG ELEMENTAL CALCIUM) 500 MG chewable tablet Chew 1,000 mg by mouth daily.      [provider]  clopidogrel (PLAVIX) 75 MG tablet TAKE 1 TABLET (75 MG TOTAL)  BY MOUTH DAILY. 05/07/19   Jim Harp, MD  FLUZONE HIGH-DOSE 0.5 ML injection TO BE ADMINISTERED BY PHARMACIST FOR IMMUNIZATION 09/13/17   [provider]  folic acid (FOLVITE) 283 MCG tablet Take 400 mcg by mouth daily.      [provider]  HYDROcodone-acetaminophen (NORCO) 5-325 MG tablet Take 1 pill every 4-6 hours as needed for severe pain 06/10/19   Jim Boyer, MD  methocarbamol (ROBAXIN) 500 MG tablet Take 1 tablet (500 mg total) by mouth 2 (two) times daily. 06/10/19   Jim Boyer, MD  metoprolol tartrate (LOPRESSOR) 25 MG tablet TAKE 12.5MG  (1/2 TABLET) BY MOUTH IN THE MORNING AND 25MG  (1 TABLET) AT BEDTIME. 04/22/16   Jim Harp, MD  Multiple Vitamin (MULTIVITAMIN) capsule Take 1 capsule by mouth daily.      [provider]  zolpidem (AMBIEN) 10 MG tablet TAKE 1 TABLET BY MOUTH AT BEDTIME AS NEEDED FOR SLEEP. 07/11/18   [provider]    Family History Family History  Problem Relation Age of Onset  . Heart attack Father   . Prostate cancer Father        mets to bone  . Cancer Father   . Heart disease Father   . Cancer Mother        ?    Social History Social History   Tobacco Use  . Smoking status: Former Smoker    Packs/day: 1.00    Years: 15.00    Pack years: 15.00    Types: Cigarettes    Quit date: 11/23/2003    Years since quitting: 15.5  . Smokeless tobacco: Never Used  . Tobacco comment: quit 10 years ago  Substance Use Topics  . Alcohol use: Yes    Comment: occasional  . Drug use: No     Allergies   Patient has no known allergies.   Review of Systems Review of Systems Constitutional: Unremarkable Musculature: As above Neurologic: Unremarkable Urologic: Unremarkable   Physical Exam Triage Vital Signs ED Triage Vitals  Enc Vitals Group     BP 06/10/19 1118 (!) 173/90     Pulse Rate 06/10/19 1118 89     Resp 06/10/19 1118 20     Temp 06/10/19 1118 98.7 F (37.1 C)     Temp Source 06/10/19  1118 Oral     SpO2 06/10/19 1118 96 %     Weight 06/10/19 1120 169 lb (76.7 kg)     Height --      Head Circumference --      Peak Flow --      Pain Score 06/10/19 1119 8     Pain Loc --  Pain Edu? --      Excl. in [place]? --    No data found.  Updated Vital Signs BP (!) 173/90 (BP Location: Right Arm)   Pulse 89   Temp 98.7 F (37.1 C) (Oral)   Resp 20   Wt 76.7 kg   SpO2 96%   BMI 22.30 kg/m   Visual Acuity Right Eye Distance:   Left Eye Distance:   Bilateral Distance:    Right Eye Near:   Left Eye Near:    Bilateral Near:     Physical Exam Pleasant man but obviously in pain if he tries to move.  Fully alert and oriented.  Abdomen soft.  Tender to percussion on L3 approximately.  Tender both sides of his back.  Flexion and extension nonpainful.  Lateral tilt is painful either way, once again very symmetrical.  Straight leg raising test.  Patient is unable to lay flat on his back with both knees extended.  Has to flex 1 of them to lay there.  However straight leg was negative.  No radiculopathy.  UC Treatments / Results  Labs (all labs ordered are listed, but only abnormal results are displayed) Labs Reviewed - No data to display  EKG   Radiology Dg Lumbar Spine Complete  Result Date: 06/10/2019 CLINICAL DATA:  73 year old presenting with acute onset of low back pain without radiculopathy that began yesterday. Possible lumbar strain. No known injury. EXAM: LUMBAR SPINE - COMPLETE 4+ VIEW COMPARISON:  Bone window images from CTA abdomen and pelvis 03/18/2014. FINDINGS: Five non-rib-bearing lumbar vertebrae with anatomic alignment. No fractures. Severe disc space narrowing and associated endplate hypertrophic changes at L5-S1, unchanged since the prior CT. Remaining disc spaces well-preserved. No pars defects. Mild facet degenerative changes at L4-5 and L5-S1. Sacroiliac joints anatomically aligned with degenerative changes. Aortoiliac atherosclerosis with prior aorto  bi-iliac stent graft placement. IMPRESSION: 1. No acute or subacute osseous abnormality. 2. Severe degenerative disc disease and spondylosis at L5-S1. 3. Mild facet degenerative changes at L4-5 and L5-S1. 4. Stable appearance since the April, 2015 CT. Electronically Signed   By: Evangeline Dakin M.D.   On: 06/10/2019 13:12   Radiology reading of the spine x-rays is still pending, but I have viewed them.  Degenerative changes with some spurring visible.  Stents visible in iliacs and lower aorta.  No compression fractures noted. Procedures Procedures (including critical care time)  Medications Ordered in UC Medications - No data to display  Initial Impression / Assessment and Plan / UC Course  I have reviewed the triage vital signs and the nursing notes.  Pertinent labs & imaging results that were available during my care of the patient were reviewed by me and considered in my medical decision making (see chart for details).     Acute low back pain about L63 in a 73 year old man.  Probable back strain but need to check for pathology or compression fracture.  Will get plain x-rays. Final Clinical Impressions(s) / UC Diagnoses   Final diagnoses:  Acute bilateral low back pain without sciatica  Strain of lumbar region, initial encounter     Discharge Instructions     Tylenol 500 mg (acetaminophen) 2 pills 3 times daily as needed for pain.  Maximum acetaminophen 3000 mg in 24 hours.  Note that the hydrocodone pain pill has some acetaminophen combined with it.  Ibuprofen 200 mg 3 or 4 pills 3 times daily as needed for pain.  Maximum ibuprofen 2400 mg in 24 hours  Norco  5/325 (hydrocodone/acetaminophen) 1 pill every 4-6  hours if needed for severe pain  Robaxin 500 mg 1 pill 3 times daily and 2 at bedtime as needed for muscle relaxant  Alternate ice and heat to the low back  Try to move around a little to keep it from getting too stiff, but avoid any heavy lifting or straining.  Get  rechecked if not improving or if getting worse at any time.    ED Prescriptions    Medication Sig Dispense Auth. Provider   methocarbamol (ROBAXIN) 500 MG tablet Take 1 tablet (500 mg total) by mouth 2 (two) times daily. 20 tablet Jim Boyer, MD   HYDROcodone-acetaminophen [facility]) 5-325 MG tablet Take 1 pill every 4-6 hours as needed for severe pain 20 tablet Jim Boyer, MD     Controlled Substance Prescriptions [place] Controlled Substance Registry consulted? Yes, I have consulted the [place] Controlled Substances Registry for this patient, and feel the risk/benefit ratio today is favorable for proceeding with this prescription for a controlled substance.   Jim Boyer, MD 06/10/19 1331

## 2019-06-10 NOTE — ED Triage Notes (Signed)
Patient reports he was moving boxes yesterday and pulled a muscle in his mid/lower back. Pain is worse with movement and standing. Taken tylenol with out relief.

## 2019-06-10 NOTE — Discharge Instructions (Addendum)
Tylenol 500 mg (acetaminophen) 2 pills 3 times daily as needed for pain.  Maximum acetaminophen 3000 mg in 24 hours.  Note that the hydrocodone pain pill has some acetaminophen combined with it.  Ibuprofen 200 mg 3 or 4 pills 3 times daily as needed for pain.  Maximum ibuprofen 2400 mg in 24 hours  Norco 5/325 (hydrocodone/acetaminophen) 1 pill every 4-6  hours if needed for severe pain  Robaxin 500 mg 1 pill 3 times daily and 2 at bedtime as needed for muscle relaxant  Alternate ice and heat to the low back  Try to move around a little to keep it from getting too stiff, but avoid any heavy lifting or straining.  Get rechecked if not improving or if getting worse at any time.

## 2019-06-19 ENCOUNTER — Other Ambulatory Visit: Payer: Self-pay | Admitting: Cardiovascular Disease

## 2019-07-20 ENCOUNTER — Other Ambulatory Visit: Payer: Self-pay

## 2019-09-24 ENCOUNTER — Other Ambulatory Visit: Payer: Self-pay

## 2019-09-24 ENCOUNTER — Emergency Department
Admission: EM | Admit: 2019-09-24 | Discharge: 2019-09-24 | Disposition: A | Discharge status: Home / Self Care | Payer: Medicare Other | Source: Home / Self Care | Attending: Family Medicine | Admitting: Family Medicine

## 2019-09-24 DIAGNOSIS — M545 Low back pain: Secondary | ICD-10-CM | POA: Diagnosis not present

## 2019-09-24 DIAGNOSIS — G8929 Other chronic pain: Secondary | ICD-10-CM

## 2019-09-24 MED ORDER — METHOCARBAMOL 500 MG PO TABS: ORAL_TABLET | ORAL | 0 refills | Status: AC

## 2019-09-24 MED ORDER — PREDNISONE 20 MG PO TABS: ORAL_TABLET | ORAL | 0 refills | Status: DC

## 2019-09-24 NOTE — ED Triage Notes (Signed)
Pt states that in the morning after waking up has lower right back pain.  Hurts to stand up straight.  As the day goes on it improves.

## 2019-09-24 NOTE — ED Provider Notes (Signed)
Jim [name] CARE    CSN: [id number] Arrival date & time: 09/24/19  1245      History   Chief Complaint Chief Complaint  Patient presents with  . Back Pain    HPI Jim [name] is a 73 y.o. male.   Patient complains of onset of right side non-radiating lower back pain about 3 weeks ago.  The pain is usually present when he wakes up each morning, and gradually resolves through the day.   He denies bowel or bladder dysfunction, and no saddle numbness.  He denies recent back injury and feels well otherwise.  The pain is worse when he stands up.  The history is provided by the patient.  Back Pain Location:  Lumbar spine Quality:  Aching Radiates to:  Does not radiate Pain severity:  Mild Worse during: worse each morning. Onset quality:  Sudden Duration:  3 weeks Timing:  Intermittent Progression:  Unchanged Chronicity:  Recurrent Context: not falling, not lifting heavy objects and not recent injury   Relieved by:  Nothing Exacerbated by: standing up. Ineffective treatments:  None tried Associated symptoms: no abdominal pain, no bladder incontinence, no bowel incontinence, no dysuria, no fever, no leg pain, no numbness, no paresthesias, no perianal numbness, no tingling, no weakness and no weight loss     Past Medical History:  Diagnosis Date  . AAA (abdominal aortic aneurysm) (HCC)    Infrarenal abdominal aortic aneurysm of 48 mm, ct 5/12, followed by Dr. Quay Burow.  Marland Kitchen CAD (coronary artery disease)        . Carotid artery disease ([place])   . History of shingles   . History of stress test 10/27/10   showed inferior posterior scar without ischemia, EF was 31% according to the myoview  . Hx of echocardiogram 01/26/2010   showed EF of 45% to 50% with basal and mid inferior/inferoseptal hypokinesia.  Marland Kitchen Hx of radiation therapy 01/13/11-03/09/11   prostate  . Hyperlipidemia   . Hypertension   . Myocardial infarction ([place])   . Neuromuscular disorder ([place]) 2002   . Prostate cancer ([place]) 2012   XRT, seed implants  . PVD (peripheral vascular disease) ([place])    Known iliac and SFA disease as well as a moderately sized abdominal aortic aneurism.  . S/P abdominal aortic aneurysm repair    performed by Dr. Trula Slade on 02/14/14 with a exclude or  . Tobacco abuse     Patient Active Problem List   Diagnosis Date Noted  . AAA (abdominal aortic aneurysm) without rupture ([place]) 03/18/2014  . AAA (abdominal aortic aneurysm) ([place]) 02/14/2014  . Abdominal aneurysm without mention of rupture 01/14/2014  . Coronary artery disease 12/18/2013  . Peripheral arterial occlusive disease ([place]) 12/18/2013  . Carotid artery disease ([place]) 12/18/2013  . Abdominal aortic aneurysm ([place]) 12/18/2013  . Tobacco abuse disorder 12/18/2013  . Hyperlipidemia 12/18/2013  . Hx of radiation therapy   . Prostate cancer ([place]) 10/11/2011  . Hx of adenomatous colonic polyps 07/19/2011    Past Surgical History:  Procedure Laterality Date  . ABDOMINAL AORTAGRAM N/A 01/29/2014   Procedure: ABDOMINAL Maxcine Ham;  Surgeon: Angelia Mould, MD;  Location: [facility] CATH LAB;  Service: Cardiovascular;  Laterality: N/A;  . ABDOMINAL AORTIC ENDOVASCULAR STENT GRAFT Bilateral 02/14/2014   Procedure: ABDOMINAL AORTIC ENDOVASCULAR STENT GRAFT- GORE;  Surgeon: Serafina Mitchell, MD;  Location: [place] OR;  Service: Vascular;  Laterality: Bilateral;  . bilateral lower extremity stents     right common femoral and left external  iliac  . CAROTID ENDARTERECTOMY Bilateral    bilateral, right in 2009 and left in 2001  . CHOLECYSTECTOMY    . COLONOSCOPY  08/09/2011   Procedure: COLONOSCOPY;  Surgeon: Daneil Dolin, MD;  [facility];  Service: Endoscopy;  Laterality: N/A;  9:15  . CORONARY ARTERY BYPASS GRAFT  2001   with a LIMA to his LAD, vein to an obtuse marginal branch and to the RCA  . HERNIA REPAIR     periumbilical  . INGUINAL HERNIA REPAIR     bilateral       Home Medications     Prior to Admission medications   Medication Sig Start Date End Date Taking? Authorizing Provider  aspirin 81 MG tablet Take 81 mg by mouth daily.      [provider]  atorvastatin (LIPITOR) 80 MG tablet TAKE 1 TABLET (80 MG TOTAL) BY MOUTH DAILY. 06/13/17   Lorretta Harp, MD  calcium carbonate (TUMS - DOSED IN MG ELEMENTAL CALCIUM) 500 MG chewable tablet Chew 1,000 mg by mouth daily.      [provider]  clopidogrel (PLAVIX) 75 MG tablet Take 1 tablet (75 mg total) by mouth daily. NEED OFFICE VISIT FOR FURTHER REFILLS 06/19/19   Lorretta Harp, MD  FLUZONE HIGH-DOSE 0.5 ML injection TO BE ADMINISTERED BY PHARMACIST FOR IMMUNIZATION 09/13/17   [provider]  folic acid (FOLVITE) Q000111Q MCG tablet Take 400 mcg by mouth daily.      [provider]  HYDROcodone-acetaminophen (NORCO) 5-325 MG tablet Take 1 pill every 4-6 hours as needed for severe pain 06/10/19   Posey Boyer, MD  methocarbamol (ROBAXIN) 500 MG tablet Take one tab PO QAM 09/24/19   Kandra Nicolas, MD  metoprolol tartrate (LOPRESSOR) 25 MG tablet TAKE 12.5MG  (1/2 TABLET) BY MOUTH IN THE MORNING AND 25MG  (1 TABLET) AT BEDTIME. 04/22/16   Lorretta Harp, MD  Multiple Vitamin (MULTIVITAMIN) capsule Take 1 capsule by mouth daily.      [provider]  predniSONE (DELTASONE) 20 MG tablet Take one tab by mouth twice daily for 4 days, then one daily. Take with food. 09/24/19   Kandra Nicolas, MD  zolpidem (AMBIEN) 10 MG tablet TAKE 1 TABLET BY MOUTH AT BEDTIME AS NEEDED FOR SLEEP. 07/11/18   [provider]    Family History Family History  Problem Relation Age of Onset  . Heart attack Father   . Prostate cancer Father        mets to bone  . Cancer Father   . Heart disease Father   . Cancer Mother        ?    Social History Social History   Tobacco Use  . Smoking status: Former Smoker    Packs/day: 1.00    Years: 15.00    Pack years: 15.00    Types: Cigarettes     Quit date: 11/23/2003    Years since quitting: 15.8  . Smokeless tobacco: Never Used  . Tobacco comment: quit 10 years ago  Substance Use Topics  . Alcohol use: Yes    Comment: occasional  . Drug use: No     Allergies   Patient has no known allergies.   Review of Systems Review of Systems  Constitutional: Negative for fever and weight loss.  Gastrointestinal: Negative for abdominal pain and bowel incontinence.  Genitourinary: Negative for bladder incontinence and dysuria.  Musculoskeletal: Positive for back pain.  Neurological: Negative for tingling, weakness,  numbness and paresthesias.  All other systems reviewed and are negative.    Physical Exam Triage Vital Signs ED Triage Vitals  Enc Vitals Group     BP 09/24/19 1319 (!) 153/84     Pulse Rate 09/24/19 1319 82     Resp 09/24/19 1319 20     Temp 09/24/19 1319 97.6 F (36.4 C)     Temp Source 09/24/19 1319 Oral     SpO2 09/24/19 1319 98 %     Weight 09/24/19 1320 173 lb (78.5 kg)     Height 09/24/19 1320 6\' 1"  (1.854 m)     Head Circumference --      Peak Flow --      Pain Score 09/24/19 1320 9     Pain Loc --      Pain Edu? --      Excl. in [place]? --    No data found.  Updated Vital Signs BP (!) 153/84 (BP Location: Right Arm)   Pulse 82   Temp 97.6 F (36.4 C) (Oral)   Resp 20   Ht 6\' 1"  (1.854 m)   Wt 78.5 kg   SpO2 98%   BMI 22.82 kg/m   Visual Acuity Right Eye Distance:   Left Eye Distance:   Bilateral Distance:    Right Eye Near:   Left Eye Near:    Bilateral Near:     Physical Exam Vitals signs and nursing note reviewed.  Constitutional:      General: He is not in acute distress. HENT:     Head: Normocephalic.     Right Ear: External ear normal.     Left Ear: External ear normal.     Nose: Nose normal.     Mouth/Throat:     Pharynx: Oropharynx is clear.  Eyes:     Pupils: Pupils are equal, round, and reactive to light.  Neck:     Musculoskeletal: Normal range of motion.   Cardiovascular:     Heart sounds: Normal heart sounds.  Pulmonary:     Breath sounds: Normal breath sounds.  Abdominal:     General: Abdomen is flat.     Tenderness: There is no abdominal tenderness.  Musculoskeletal:       Back:     Right lower leg: No edema.     Left lower leg: No edema.     Comments: Back:  Range of motion relatively well preserved.  Can heel/toe walk and squat without difficulty.    Tenderness in the left paraspinous muscles from L3 to Sacral area, and in the right paraspinous areas from L2 to sacral area.  Straight leg raising test is negative.  Sitting knee extension test is negative.  Strength and sensation in the lower extremities is normal.  Patellar and achilles reflexes are normal  FABER negative.  Skin:    General: Skin is warm and dry.     Findings: No rash.  Neurological:     Mental Status: He is alert.      UC Treatments / Results  Labs (all labs ordered are listed, but only abnormal results are displayed) Labs Reviewed - No data to display  EKG   Radiology No results found.  Procedures Procedures (including critical care time)  Medications Ordered in UC Medications - No data to display  Initial Impression / Assessment and Plan / UC Course  I have reviewed the triage vital signs and the nursing notes.  Pertinent labs & imaging results that were available  during my care of the patient were reviewed by me and considered in my medical decision making (see chart for details).    Begin prednisone burst/taper, and Robaxin QAM. Followup with Dr. Aundria Mems ([facility]) if not improving about two weeks.  Final Clinical Impressions(s) / UC Diagnoses   Final diagnoses:  Chronic right-sided low back pain without sciatica     Discharge Instructions     Apply ice pack for 20 to 30 minutes, 3 to 4 times daily  Continue until pain and swelling decrease.  Begin range of motion and stretching exercises as tolerated.     ED Prescriptions    Medication Sig Dispense Auth. Provider   methocarbamol (ROBAXIN) 500 MG tablet Take one tab PO QAM 10 tablet Kandra Nicolas, MD   predniSONE (DELTASONE) 20 MG tablet Take one tab by mouth twice daily for 4 days, then one daily. Take with food. 12 tablet Kandra Nicolas, MD        Kandra Nicolas, MD 09/24/19 [phone]

## 2019-09-24 NOTE — Discharge Instructions (Addendum)
Apply ice pack for 20 to 30 minutes, 3 to 4 times daily  Continue until pain and swelling decrease.  Begin range of motion and stretching exercises as tolerated. 

## 2019-12-10 ENCOUNTER — Encounter (HOSPITAL_COMMUNITY): Payer: Medicare Other

## 2019-12-10 ENCOUNTER — Other Ambulatory Visit (HOSPITAL_COMMUNITY): Payer: Medicare Other

## 2019-12-10 ENCOUNTER — Ambulatory Visit: Payer: Medicare Other

## 2020-01-15 ENCOUNTER — Other Ambulatory Visit: Payer: Self-pay

## 2020-01-15 DIAGNOSIS — I714 Abdominal aortic aneurysm, without rupture, unspecified: Secondary | ICD-10-CM

## 2020-01-15 DIAGNOSIS — I70213 Atherosclerosis of native arteries of extremities with intermittent claudication, bilateral legs: Secondary | ICD-10-CM

## 2020-01-16 ENCOUNTER — Ambulatory Visit (HOSPITAL_COMMUNITY)
Admission: RE | Admit: 2020-01-16 | Discharge: 2020-01-16 | Disposition: A | Discharge status: Home / Self Care | Payer: Medicare Other | Source: Ambulatory Visit | Attending: Vascular Surgery | Admitting: Vascular Surgery

## 2020-01-16 ENCOUNTER — Other Ambulatory Visit: Payer: Self-pay

## 2020-01-16 ENCOUNTER — Ambulatory Visit (INDEPENDENT_AMBULATORY_CARE_PROVIDER_SITE_OTHER)
Admission: RE | Admit: 2020-01-16 | Discharge: 2020-01-16 | Disposition: A | Discharge status: Home / Self Care | Payer: Medicare Other | Source: Ambulatory Visit | Attending: Vascular Surgery | Admitting: Vascular Surgery

## 2020-01-16 ENCOUNTER — Ambulatory Visit: Payer: Medicare Other | Admitting: Physician Assistant

## 2020-01-16 VITALS — BP 131/71 | HR 69 | Temp 97.0°F | Resp 16 | Ht 73.0 in | Wt 175.0 lb

## 2020-01-16 DIAGNOSIS — I779 Disorder of arteries and arterioles, unspecified: Secondary | ICD-10-CM

## 2020-01-16 DIAGNOSIS — I714 Abdominal aortic aneurysm, without rupture, unspecified: Secondary | ICD-10-CM

## 2020-01-16 DIAGNOSIS — I70213 Atherosclerosis of native arteries of extremities with intermittent claudication, bilateral legs: Secondary | ICD-10-CM | POA: Insufficient documentation

## 2020-01-16 NOTE — Progress Notes (Signed)
Established EVAR   History of Present Illness   Jim [name] is a 74 y.o. (Oct 15, 1946) male who presents for routine follow up s/p EVAR by Dr. Trula Slade (Date: 09/2016).  He denies any new or changing abdominal or back pain.  He continues to take aspirin and statin daily.  Patient also has known PAD with known SFA occlusions.  PAD is followed by his cardiologist.  He denies any significant claudication symptoms.  Also denies any tissue loss or rest pain of bilateral lower extremities.  He is also maintained on Plavix.  The patient's PMH, PSH, SH, and FamHx were reviewed and are unchanged from prior visit.  Current Outpatient Medications  Medication Sig Dispense Refill  . aspirin 81 MG tablet Take 81 mg by mouth daily.      Marland Kitchen atorvastatin (LIPITOR) 80 MG tablet TAKE 1 TABLET (80 MG TOTAL) BY MOUTH DAILY. 90 tablet 3  . calcium carbonate (TUMS - DOSED IN MG ELEMENTAL CALCIUM) 500 MG chewable tablet Chew 1,000 mg by mouth daily.      . clopidogrel (PLAVIX) 75 MG tablet Take 1 tablet (75 mg total) by mouth daily. NEED OFFICE VISIT FOR FURTHER REFILLS 30 tablet 0  . FLUZONE HIGH-DOSE 0.5 ML injection TO BE ADMINISTERED BY PHARMACIST FOR IMMUNIZATION  0  . folic acid (FOLVITE) Q000111Q MCG tablet Take 400 mcg by mouth daily.      Marland Kitchen HYDROcodone-acetaminophen (NORCO) 5-325 MG tablet Take 1 pill every 4-6 hours as needed for severe pain 20 tablet 0  . methocarbamol (ROBAXIN) 500 MG tablet Take one tab PO QAM 10 tablet 0  . metoprolol tartrate (LOPRESSOR) 25 MG tablet TAKE 12.5MG  (1/2 TABLET) BY MOUTH IN THE MORNING AND 25MG  (1 TABLET) AT BEDTIME. 135 tablet 2  . Multiple Vitamin (MULTIVITAMIN) capsule Take 1 capsule by mouth daily.      . predniSONE (DELTASONE) 20 MG tablet Take one tab by mouth twice daily for 4 days, then one daily. Take with food. 12 tablet 0  . zolpidem (AMBIEN) 10 MG tablet TAKE 1 TABLET BY MOUTH AT BEDTIME AS NEEDED FOR SLEEP.     No current facility-administered  medications for this visit.    On ROS today: 10 system ROS negative unless otherwise noted in HPI  Physical Examination   Vitals:   01/16/20 0838  BP: 131/71  Pulse: 69  Resp: 16  Temp: (!) 97 F (36.1 C)  TempSrc: Oral  SpO2: 99%  Weight: 175 lb (79.4 kg)  Height: 6\' 1"  (1.854 m)   Body mass index is 23.09 kg/m.  General Alert, O x 3, WD, NAD  Pulmonary Sym exp, good B air movt, CTA B  Cardiac RRR, Nl S1, S2,   Vascular Vessel Right Left  Radial Palpable Palpable  Brachial Palpable Palpable  PT Not palpable Not palpable  DP Not palpable Not palpable    Gastro- intestinal soft, non-distended, non-tender to palpation,   Musculo- skeletal M/S 5/5 throughout  , Extremities without ischemic changes  , No edema present, No visible varicosities , No Lipodermatosclerosis present  Neurologic Pain and light touch intact in extremities , Motor exam as listed above    Non-Invasive Vascular Imaging   EVAR Duplex  AAA sac size: 4.3 cm at largest diameter  no endoleak detected  ABI/TBIToday's ABIToday's TBIPrevious ABIPrevious TBI  +-------+-----------+-----------+------------+------------+  Right 0.58    0.41    0.45    0        +-------+-----------+-----------+------------+------------+  Left  0.71    0.36    0.58    0.64        Medical Decision Making   Jim [name] is a 74 y.o. male who presents s/p EVAR 09/2016   Endograft duplex shows a stable aneurysmal sac size and is negative for endoleaks  The next endograft duplex will be scheduled for 1 year  Despite known SFA occlusions, no indication for revascularization given lack of claudication, rest pain, or tissue loss  Continue aspirin and statin daily     Dagoberto Ligas PA-C [facility] Office: [phone]  Clinic MD: Oneida Alar

## 2020-01-18 ENCOUNTER — Other Ambulatory Visit: Payer: Self-pay | Admitting: *Deleted

## 2020-01-18 DIAGNOSIS — I779 Disorder of arteries and arterioles, unspecified: Secondary | ICD-10-CM

## 2020-01-18 DIAGNOSIS — Z95828 Presence of other vascular implants and grafts: Secondary | ICD-10-CM

## 2020-02-14 ENCOUNTER — Emergency Department
Admission: EM | Admit: 2020-02-14 | Discharge: 2020-02-14 | Disposition: A | Discharge status: Home / Self Care | Payer: Medicare Other | Source: Home / Self Care

## 2020-02-14 ENCOUNTER — Other Ambulatory Visit: Payer: Self-pay

## 2020-02-14 DIAGNOSIS — R062 Wheezing: Secondary | ICD-10-CM

## 2020-02-14 DIAGNOSIS — J069 Acute upper respiratory infection, unspecified: Secondary | ICD-10-CM | POA: Diagnosis not present

## 2020-02-14 MED ORDER — AZITHROMYCIN 250 MG PO TABS: 250.0000 mg | ORAL_TABLET | Freq: Every day | ORAL | 0 refills | Status: DC

## 2020-02-14 MED ORDER — FLUTICASONE PROPIONATE 50 MCG/ACT NA SUSP: 2.0000 | Freq: Every day | NASAL | 2 refills | Status: AC

## 2020-02-14 NOTE — ED Provider Notes (Signed)
[name]    CSN: [id number] Arrival date & time: 02/14/20  1018      History   Chief Complaint Chief Complaint  Patient presents with  . Sinus Issues    HPI TODD WEIHER is a 74 y.o. male.   HPI  JSEAN STOCKHAM is a 74 y.o. male presenting to UC with c/o 2-3 days of nasal congestion that worsened yesterday with a wet cough with yellow phlegm. He has taken claritin w/o relief. No hx of asthma. Denies fever, chills, n/v/d but has had mild body aches and fatigue. Denies chest pain and SOB. No known sick contacts. He received his first [place] Covid vaccine on 02/05/2020.     Past Medical History:  Diagnosis Date  . AAA (abdominal aortic aneurysm) (HCC)    Infrarenal abdominal aortic aneurysm of 48 mm, ct 5/12, followed by Dr. Quay Burow.  Marland Kitchen CAD (coronary artery disease)        . Carotid artery disease ([place])   . History of shingles   . History of stress test 10/27/10   showed inferior posterior scar without ischemia, EF was 31% according to the myoview  . Hx of echocardiogram 01/26/2010   showed EF of 45% to 50% with basal and mid inferior/inferoseptal hypokinesia.  Marland Kitchen Hx of radiation therapy 01/13/11-03/09/11   prostate  . Hyperlipidemia   . Hypertension   . Myocardial infarction ([place])   . Neuromuscular disorder ([place]) 2002  . Prostate cancer ([place]) 2012   XRT, seed implants  . PVD (peripheral vascular disease) ([place])    Known iliac and SFA disease as well as a moderately sized abdominal aortic aneurism.  . S/P abdominal aortic aneurysm repair    performed by Dr. Trula Slade on 02/14/14 with a exclude or  . Tobacco abuse     Patient Active Problem List   Diagnosis Date Noted  . AAA (abdominal aortic aneurysm) without rupture ([place]) 03/18/2014  . AAA (abdominal aortic aneurysm) ([place]) 02/14/2014  . Abdominal aneurysm without mention of rupture 01/14/2014  . Coronary artery disease 12/18/2013  . Peripheral arterial occlusive disease ([place]) 12/18/2013    . Carotid artery disease ([place]) 12/18/2013  . Abdominal aortic aneurysm ([place]) 12/18/2013  . Tobacco abuse disorder 12/18/2013  . Hyperlipidemia 12/18/2013  . Hx of radiation therapy   . Prostate cancer ([place]) 10/11/2011  . Hx of adenomatous colonic polyps 07/19/2011    Past Surgical History:  Procedure Laterality Date  . ABDOMINAL AORTAGRAM N/A 01/29/2014   Procedure: ABDOMINAL Maxcine Ham;  Surgeon: Angelia Mould, MD;  Location: [facility] CATH LAB;  Service: Cardiovascular;  Laterality: N/A;  . ABDOMINAL AORTIC ENDOVASCULAR STENT GRAFT Bilateral 02/14/2014   Procedure: ABDOMINAL AORTIC ENDOVASCULAR STENT GRAFT- GORE;  Surgeon: Serafina Mitchell, MD;  Location: [facility] OR;  Service: Vascular;  Laterality: Bilateral;  . bilateral lower extremity stents     right common femoral and left external iliac  . CAROTID ENDARTERECTOMY Bilateral    bilateral, right in 2009 and left in 2001  . CHOLECYSTECTOMY    . COLONOSCOPY  08/09/2011   Procedure: COLONOSCOPY;  Surgeon: Daneil Dolin, MD;  Location: [facility];  Service: Endoscopy;  Laterality: N/A;  9:15  . CORONARY ARTERY BYPASS GRAFT  2001   with a LIMA to his LAD, vein to an obtuse marginal branch and to the RCA  . HERNIA REPAIR     periumbilical  . INGUINAL HERNIA REPAIR     bilateral       Home  Medications    Prior to Admission medications   Medication Sig Start Date End Date Taking? Authorizing Provider  aspirin 81 MG tablet Take 81 mg by mouth daily.      [provider]  atorvastatin (LIPITOR) 80 MG tablet TAKE 1 TABLET (80 MG TOTAL) BY MOUTH DAILY. 06/13/17   Lorretta Harp, MD  azithromycin (ZITHROMAX) 250 MG tablet Take 1 tablet (250 mg total) by mouth daily. Take first 2 tablets together, then 1 every day until finished. 02/14/20   Noe Gens, PA-C  calcium carbonate (TUMS - DOSED IN MG ELEMENTAL CALCIUM) 500 MG chewable tablet Chew 1,000 mg by mouth daily.      [provider]  clopidogrel (PLAVIX) 75 MG  tablet Take 1 tablet (75 mg total) by mouth daily. NEED OFFICE VISIT FOR FURTHER REFILLS 06/19/19   Lorretta Harp, MD  fluticasone [facility]) 50 MCG/ACT nasal spray Place 2 sprays into both nostrils daily. 02/14/20   Arnesia Vincelette O, PA-C  FLUZONE HIGH-DOSE 0.5 ML injection TO BE ADMINISTERED BY PHARMACIST FOR IMMUNIZATION 09/13/17   [provider]  folic acid (FOLVITE) Q000111Q MCG tablet Take 400 mcg by mouth daily.      [provider]  HYDROcodone-acetaminophen (NORCO) 5-325 MG tablet Take 1 pill every 4-6 hours as needed for severe pain 06/10/19   Posey Boyer, MD  methocarbamol (ROBAXIN) 500 MG tablet Take one tab PO QAM 09/24/19   Kandra Nicolas, MD  metoprolol tartrate (LOPRESSOR) 25 MG tablet TAKE 12.5MG  (1/2 TABLET) BY MOUTH IN THE MORNING AND 25MG  (1 TABLET) AT BEDTIME. 04/22/16   Lorretta Harp, MD  Multiple Vitamin (MULTIVITAMIN) capsule Take 1 capsule by mouth daily.      [provider]  predniSONE (DELTASONE) 20 MG tablet Take one tab by mouth twice daily for 4 days, then one daily. Take with food. 09/24/19   Kandra Nicolas, MD  zolpidem (AMBIEN) 10 MG tablet TAKE 1 TABLET BY MOUTH AT BEDTIME AS NEEDED FOR SLEEP. 07/11/18   [provider]    Family History Family History  Problem Relation Age of Onset  . Heart attack Father   . Prostate cancer Father        mets to bone  . Cancer Father   . Heart disease Father   . Cancer Mother        ?    Social History Social History   Tobacco Use  . Smoking status: Former Smoker    Packs/day: 1.00    Years: 15.00    Pack years: 15.00    Types: Cigarettes    Quit date: 11/23/2003    Years since quitting: 16.2  . Smokeless tobacco: Never Used  . Tobacco comment: quit 10 years ago  Substance Use Topics  . Alcohol use: Yes    Comment: occasional  . Drug use: No     Allergies   Patient has no known allergies.   Review of Systems Review of Systems  Constitutional: Positive for fatigue.  Negative for chills and fever.  HENT: Positive for congestion, postnasal drip and sinus pressure. Negative for ear pain, sinus pain, sore throat, trouble swallowing and voice change.   Respiratory: Positive for cough. Negative for shortness of breath.   Cardiovascular: Negative for chest pain and palpitations.  Gastrointestinal: Negative for abdominal pain, diarrhea, nausea and vomiting.  Musculoskeletal: Negative for arthralgias, back pain and myalgias.  Skin: Negative for rash.  Neurological: Negative for dizziness, light-headedness and headaches.  All other systems reviewed and are negative.    Physical Exam Triage Vital Signs ED Triage Vitals  Enc Vitals Group     BP 02/14/20 1030 (!) 141/84     Pulse Rate 02/14/20 1030 93     Resp 02/14/20 1030 18     Temp 02/14/20 1030 97.9 F (36.6 C)     Temp Source 02/14/20 1030 Oral     SpO2 02/14/20 1030 97 %     Weight 02/14/20 1028 178 lb (80.7 kg)     Height 02/14/20 1028 6\' 1"  (1.854 m)     Head Circumference --      Peak Flow --      Pain Score 02/14/20 1028 0     Pain Loc --      Pain Edu? --      Excl. in [place]? --    No data found.  Updated Vital Signs BP (!) 141/84 (BP Location: Right Arm)   Pulse 93   Temp 97.9 F (36.6 C) (Oral)   Resp 18   Ht 6\' 1"  (1.854 m)   Wt 178 lb (80.7 kg)   SpO2 97%   BMI 23.48 kg/m   Visual Acuity Right Eye Distance:   Left Eye Distance:   Bilateral Distance:    Right Eye Near:   Left Eye Near:    Bilateral Near:     Physical Exam Vitals and nursing note reviewed.  Constitutional:      Appearance: Normal appearance. He is well-developed.  HENT:     Head: Normocephalic and atraumatic.     Right Ear: Tympanic membrane and ear canal normal.     Left Ear: Tympanic membrane and ear canal normal.     Nose: Nose normal.     Mouth/Throat:     Mouth: Mucous membranes are moist.     Pharynx: Oropharynx is clear.  Cardiovascular:     Rate and Rhythm: Normal rate and regular rhythm.   Pulmonary:     Effort: Pulmonary effort is normal. No respiratory distress.     Breath sounds: No stridor. Wheezing (Right lower lung field) present. No rhonchi.  Musculoskeletal:        General: Normal range of motion.     Cervical back: Normal range of motion.  Skin:    General: Skin is warm and dry.     Capillary Refill: Capillary refill takes less than 2 seconds.  Neurological:     Mental Status: He is alert and oriented to person, place, and time.  Psychiatric:        Behavior: Behavior normal.      UC Treatments / Results  Labs (all labs ordered are listed, but only abnormal results are displayed) Labs Reviewed - No data to display  EKG   Radiology No results found.  Procedures Procedures (including critical care time)  Medications Ordered in UC Medications - No data to display  Initial Impression / Assessment and Plan / UC Course  I have reviewed the triage vital signs and the nursing notes.  Pertinent labs & imaging results that were available during my care of the patient were reviewed by me and considered in my medical decision making (see chart for details).     Wheeze in just the Right lower lung field Will cover for potential early pneumonia AVS provided  Final Clinical Impressions(s) / UC Diagnoses   Final diagnoses:  Acute upper respiratory infection  Wheeze     Discharge Instructions  You may take 500mg  acetaminophen every 4-6 hours or in combination with ibuprofen 400-600mg  every 6-8 hours as needed for pain, inflammation, and fever.  Be sure to well hydrated with clear liquids and get at least 8 hours of sleep at night, preferably more while sick.   Please follow up with family medicine in 1 week if needed.     ED Prescriptions    Medication Sig Dispense Auth. Provider   azithromycin (ZITHROMAX) 250 MG tablet Take 1 tablet (250 mg total) by mouth daily. Take first 2 tablets together, then 1 every day until finished. 6 tablet  Gerarda Fraction, Jakara Blatter O, PA-C   fluticasone (FLONASE) 50 MCG/ACT nasal spray Place 2 sprays into both nostrils daily. 16 g Noe Gens, [place]     PDMP not reviewed this encounter.   Noe Gens, [place] 02/14/20 1154

## 2020-02-14 NOTE — Discharge Instructions (Signed)
  You may take 500mg acetaminophen every 4-6 hours or in combination with ibuprofen 400-600mg every 6-8 hours as needed for pain, inflammation, and fever.  Be sure to well hydrated with clear liquids and get at least 8 hours of sleep at night, preferably more while sick.   Please follow up with family medicine in 1 week if needed.   

## 2020-02-14 NOTE — ED Triage Notes (Signed)
Pt c/o sinus issues and/or chest cold since yesterday. Coughing up yellow phlegm. Taking Claritin prn.

## 2021-01-15 ENCOUNTER — Other Ambulatory Visit: Payer: Self-pay

## 2021-01-15 ENCOUNTER — Ambulatory Visit (HOSPITAL_COMMUNITY)
Admission: RE | Admit: 2021-01-15 | Discharge: 2021-01-15 | Disposition: A | Discharge status: Home / Self Care | Payer: Medicare Other | Source: Ambulatory Visit | Attending: Surgery | Admitting: Surgery

## 2021-01-15 ENCOUNTER — Ambulatory Visit: Payer: Medicare Other | Admitting: Physician Assistant

## 2021-01-15 ENCOUNTER — Other Ambulatory Visit: Payer: Self-pay | Admitting: *Deleted

## 2021-01-15 ENCOUNTER — Ambulatory Visit (INDEPENDENT_AMBULATORY_CARE_PROVIDER_SITE_OTHER)
Admission: RE | Admit: 2021-01-15 | Discharge: 2021-01-15 | Disposition: A | Discharge status: Home / Self Care | Payer: Medicare Other | Source: Ambulatory Visit | Attending: Surgery | Admitting: Surgery

## 2021-01-15 VITALS — BP 126/71 | HR 81 | Temp 97.7°F | Resp 20 | Ht 73.0 in | Wt 172.7 lb

## 2021-01-15 DIAGNOSIS — I6529 Occlusion and stenosis of unspecified carotid artery: Secondary | ICD-10-CM

## 2021-01-15 DIAGNOSIS — I739 Peripheral vascular disease, unspecified: Secondary | ICD-10-CM | POA: Diagnosis not present

## 2021-01-15 DIAGNOSIS — Z95828 Presence of other vascular implants and grafts: Secondary | ICD-10-CM | POA: Diagnosis present

## 2021-01-15 DIAGNOSIS — I779 Disorder of arteries and arterioles, unspecified: Secondary | ICD-10-CM | POA: Insufficient documentation

## 2021-01-15 NOTE — Progress Notes (Signed)
HISTORY AND PHYSICAL     CC:  follow up. Requesting Provider:  Thurman Coyer, MD  HPI: This is a 75 y.o. male who is here today for follow up for PAD.  He has hx of EVAR in November 2017 by Dr. Trula Slade.  He continues to do well with this and denies any abdominal or back pain.  Pt also has hx of PAD with known SFA occlusion.  He states his legs are unchanged.  He denies any wounds or rest pain or claudication.    Pt has hx of right CEA in 2008 by Dr. Trula Slade.  He denies any amaurosis fugax, speech difficulties, facial droop or unilateral weakness, numbness or paralysis.  He has not had a carotid duplex since 2015.     The pt is on a statin for cholesterol management.    The pt is on an aspirin.    Other AC:  Plavix The pt is on BB for hypertension.  The pt does not have diabetes. Tobacco hx:  former   Past Medical History:  Diagnosis Date  . AAA (abdominal aortic aneurysm) (HCC)    Infrarenal abdominal aortic aneurysm of 48 mm, ct 5/12, followed by Dr. Quay Burow.  Marland Kitchen CAD (coronary artery disease)        . Carotid artery disease ([place])   . History of shingles   . History of stress test 10/27/10   showed inferior posterior scar without ischemia, EF was 31% according to the myoview  . Hx of echocardiogram 01/26/2010   showed EF of 45% to 50% with basal and mid inferior/inferoseptal hypokinesia.  Marland Kitchen Hx of radiation therapy 01/13/11-03/09/11   prostate  . Hyperlipidemia   . Hypertension   . Myocardial infarction ([place])   . Neuromuscular disorder ([place]) 2002  . Prostate cancer ([place]) 2012   XRT, seed implants  . PVD (peripheral vascular disease) ([place])    Known iliac and SFA disease as well as a moderately sized abdominal aortic aneurism.  . S/P abdominal aortic aneurysm repair    performed by Dr. Trula Slade on 02/14/14 with a exclude or  . Tobacco abuse     Past Surgical History:  Procedure Laterality Date  . ABDOMINAL AORTAGRAM N/A 01/29/2014   Procedure: ABDOMINAL  Maxcine Ham;  Surgeon: Angelia Mould, MD;  Location: [facility] CATH LAB;  Service: Cardiovascular;  Laterality: N/A;  . ABDOMINAL AORTIC ENDOVASCULAR STENT GRAFT Bilateral 02/14/2014   Procedure: ABDOMINAL AORTIC ENDOVASCULAR STENT GRAFT- GORE;  Surgeon: Serafina Mitchell, MD;  Location: [place] OR;  Service: Vascular;  Laterality: Bilateral;  . bilateral lower extremity stents     right common femoral and left external iliac  . CAROTID ENDARTERECTOMY Bilateral    bilateral, right in 2009 and left in 2001  . CHOLECYSTECTOMY    . COLONOSCOPY  08/09/2011   Procedure: COLONOSCOPY;  Surgeon: Daneil Dolin, MD;  [facility];  Service: Endoscopy;  Laterality: N/A;  9:15  . CORONARY ARTERY BYPASS GRAFT  2001   with a LIMA to his LAD, vein to an obtuse marginal branch and to the RCA  . HERNIA REPAIR     periumbilical  . INGUINAL HERNIA REPAIR     bilateral    No Known Allergies  Current Outpatient Medications  Medication Sig Dispense Refill  . Ascorbic Acid (VITAMIN C WITH ROSE HIPS) 1000 MG tablet TAKE 2 (TWO) TABLET BY MOUTH DAILY    . aspirin 81 MG tablet Take 81 mg by mouth daily.    Marland Kitchen  atorvastatin (LIPITOR) 80 MG tablet TAKE 1 TABLET (80 MG TOTAL) BY MOUTH DAILY. 90 tablet 3  . budesonide-formoterol (SYMBICORT) 160-4.5 MCG/ACT inhaler Inhale into the lungs.    . calcium carbonate (TUMS - DOSED IN MG ELEMENTAL CALCIUM) 500 MG chewable tablet Chew 1,000 mg by mouth daily.    . clopidogrel (PLAVIX) 75 MG tablet Take 1 tablet (75 mg total) by mouth daily. NEED OFFICE VISIT FOR FURTHER REFILLS 30 tablet 0  . fluticasone (FLONASE) 50 MCG/ACT nasal spray Place 2 sprays into both nostrils daily. 16 g 2  . FLUZONE HIGH-DOSE 0.5 ML injection TO BE ADMINISTERED BY PHARMACIST FOR IMMUNIZATION  0  . folic acid (FOLVITE) 409 MCG tablet Take 400 mcg by mouth daily.    . methocarbamol (ROBAXIN) 500 MG tablet Take one tab PO QAM 10 tablet 0  . metoprolol tartrate (LOPRESSOR) 25 MG tablet TAKE 12.5MG   (1/2 TABLET) BY MOUTH IN THE MORNING AND 25MG  (1 TABLET) AT BEDTIME. 135 tablet 2  . Multiple Vitamin (MULTIVITAMIN) capsule Take 1 capsule by mouth daily.    Marland Kitchen zolpidem (AMBIEN) 10 MG tablet TAKE 1 TABLET BY MOUTH AT BEDTIME AS NEEDED FOR SLEEP.     No current facility-administered medications for this visit.    Family History  Problem Relation Age of Onset  . Heart attack Father   . Prostate cancer Father        mets to bone  . Cancer Father   . Heart disease Father   . Cancer Mother        ?    Social History   Socioeconomic History  . Marital status: Single    Spouse name: Not on file  . Number of children: 0  . Years of education: Not on file  . Highest education level: Not on file  Occupational History    Employer: [employer]  Tobacco Use  . Smoking status: Former Smoker    Packs/day: 1.00    Years: 15.00    Pack years: 15.00    Types: Cigarettes    Quit date: 11/23/2003    Years since quitting: 17.1  . Smokeless tobacco: Never Used  . Tobacco comment: quit 10 years ago  Vaping Use  . Vaping Use: Never used  Substance and Sexual Activity  . Alcohol use: Yes    Comment: occasional  . Drug use: No  . Sexual activity: Not on file  Other Topics Concern  . Not on file  Social History Narrative  . Not on file   Social Determinants of Health   Financial Resource Strain: Not on file  Food Insecurity: Not on file  Transportation Needs: Not on file  Physical Activity: Not on file  Stress: Not on file  Social Connections: Not on file  Intimate Partner Violence: Not on file     REVIEW OF SYSTEMS:   [X]  denotes positive finding, [ ]  denotes negative finding Cardiac  Comments:  Chest pain or chest pressure:    Shortness of breath upon exertion:    Short of breath when lying flat:    Irregular heart rhythm:        Vascular    Pain in calf, thigh, or hip brought on by ambulation:    Pain in feet at night that wakes you up from your sleep:     Blood  clot in your veins:    Leg swelling:         Pulmonary    Oxygen at home:  Productive cough:     Wheezing:         Neurologic    Sudden weakness in arms or legs:     Sudden numbness in arms or legs:     Sudden onset of difficulty speaking or slurred speech:    Temporary loss of vision in one eye:     Problems with dizziness:         Gastrointestinal    Blood in stool:     Vomited blood:         Genitourinary    Burning when urinating:     Blood in urine:        Psychiatric    Major depression:         Hematologic    Bleeding problems:    Problems with blood clotting too easily:        Skin    Rashes or ulcers:        Constitutional    Fever or chills:      PHYSICAL EXAMINATION:  Today's Vitals   01/15/21 1033  BP: 126/71  Pulse: 81  Resp: 20  Temp: 97.7 F (36.5 C)  TempSrc: Temporal  SpO2: 98%  Weight: 172 lb 11.2 oz (78.3 kg)  Height: 6\' 1"  (1.854 m)   Body mass index is 22.79 kg/m.   General:  WDWN in NAD; vital signs documented above Gait: Not observed HENT: WNL, normocephalic Pulmonary: normal non-labored breathing , without wheezing Cardiac: regular HR, without  Murmur; with carotid bruit (left) Abdomen: soft, NT, no masses; aortic pulse is not palpable Skin: without rashes; well healed right neck incision Vascular Exam/Pulses:  Right Left  Radial 2+ (normal) 2+ (normal)  Ulnar Unable to palpate Unable to palpate  Femoral 2+ (normal) 2+ (normal)  Popliteal Unable to palpate Unable to palpate  DP monophasic monophasic  PT monophasic monophasic   Extremities: pt with cyanotic appearing toes; no wounds present.  There is hemosiderin staining bilateral lower extremities Musculoskeletal: no muscle wasting or atrophy  Neurologic: A&O X 3;  No focal weakness or paresthesias are detected Psychiatric:  The pt has Normal affect.   Non-Invasive Vascular Imaging:   ABI's/TBI's on 01/15/2021: Right:  0.51/0.16 - Great toe pressure: 22 Left:   0.60/0.14 - Great toe pressure: 19  Previous ABI's/TBI's on 01/16/2020: Right:  0.58/0.41 - Great toe pressure: 51 Left:  0.71/0.36 - Great toe pressure:  45  EVAR duplex 01/16/2020: Abdominal Aorta Findings:  +--------+-------+----------+----------+--------+--------+--------+  LocationAP (cm)Trans (cm)PSV (cm/s)WaveformThrombusComments  +--------+-------+----------+----------+--------+--------+--------+  Proximal2.29  2.38   59                  +--------+-------+----------+----------+--------+--------+--------+     Endovascular Aortic Repair (EVAR):  +----------+----------------+-------------------+-------------------+       Diameter AP (cm)Diameter Trans (cm)Velocities (cm/sec)  +----------+----------------+-------------------+-------------------+  Aorta   4.23      4.03        59           +----------+----------------+-------------------+-------------------+  Right Limb1.59      1.73        41           +----------+----------------+-------------------+-------------------+  Left Limb 1.44      1.76        31           +----------+----------------+-------------------+-------------------+   Summary:  Abdominal Aorta: Patent endovascular aneurysm repair with no evidence of  endoleak. The largest aortic diameter remains essentially unchanged  compared to prior exam. Previous diameter measurement was 4.3  cm obtained   Previous carotid duplex 12/20/2013: 1-49% bilateral ICA stenosis   ASSESSMENT/PLAN:: 75 y.o. male here for follow up for EVAR/PAD  AAA s/p EVAR -pt doing well with essentially no change on duplex and without endoleak.  He does not have abdominal or back pain. -pt will f/u in one year with duplex.  PAD -pt's ABI's/TBI with decrease from previous exam and he has known bilateral SFA occlusions.  Pt has no indication for revascularization at this  time as he does not have claudication, rest pain or wounds.  Will get ABI in one year.  He knows that if he develops rest pain or wounds to call us sooner.   Carotid bruit (left) and hx of right CEA 2008 -pt asymptomatic.  He has not had a duplex of his carotids since 2015.  I hear a bruit on the left today.  Will have him return in the next few weeks to get duplex.  He understands that if he develops any symptoms, which were reviewed, he knows to get to the ER or call 911.    Leontine Locket, [facility] [phone]  Clinic MD:   Oneida Alar

## 2021-02-09 ENCOUNTER — Ambulatory Visit (HOSPITAL_COMMUNITY)
Admission: RE | Admit: 2021-02-09 | Discharge: 2021-02-09 | Disposition: A | Discharge status: Home / Self Care | Payer: Medicare Other | Source: Ambulatory Visit | Attending: Surgery | Admitting: Surgery

## 2021-02-09 ENCOUNTER — Other Ambulatory Visit: Payer: Self-pay

## 2021-02-09 ENCOUNTER — Ambulatory Visit: Payer: Medicare Other | Admitting: Physician Assistant

## 2021-02-09 VITALS — BP 125/68 | HR 69 | Temp 97.3°F | Resp 18 | Ht 73.0 in | Wt 174.0 lb

## 2021-02-09 DIAGNOSIS — I6529 Occlusion and stenosis of unspecified carotid artery: Secondary | ICD-10-CM

## 2021-02-09 NOTE — Progress Notes (Signed)
Office Note     CC:  follow up Requesting Provider:  Cloward, Dianna Rossetti, MD  HPI: Jim [name] is a 75 y.o. (February 05, 1946) male who presents for follow-up bilateral carotid artery stenosis.  The patient has a history of bilateral carotid endarterectomies.  His right carotid endarterectomy was performed in 2008 for asymptomatic disease by Dr. Trula Slade.  He cannot recall the year of the left side was performed.  He denies monocular blindness, slurred speech, facial drooping, extremity weakness or numbness.  He is compliant with aspirin, statin and Plavix.  He has a history of peripheral arterial disease and abdominal aortic aneurysm status post EVAR.  He was recently seen and evaluated in regards to these processes.   Past Medical History:  Diagnosis Date  . AAA (abdominal aortic aneurysm) (HCC)    Infrarenal abdominal aortic aneurysm of 48 mm, ct 5/12, followed by Dr. Quay Burow.  Marland Kitchen CAD (coronary artery disease)        . Carotid artery disease ([place])   . History of shingles   . History of stress test 10/27/10   showed inferior posterior scar without ischemia, EF was 31% according to the myoview  . Hx of echocardiogram 01/26/2010   showed EF of 45% to 50% with basal and mid inferior/inferoseptal hypokinesia.  Marland Kitchen Hx of radiation therapy 01/13/11-03/09/11   prostate  . Hyperlipidemia   . Hypertension   . Myocardial infarction ([place])   . Neuromuscular disorder ([place]) 2002  . Prostate cancer ([place]) 2012   XRT, seed implants  . PVD (peripheral vascular disease) ([place])    Known iliac and SFA disease as well as a moderately sized abdominal aortic aneurism.  . S/P abdominal aortic aneurysm repair    performed by Dr. Trula Slade on 02/14/14 with a exclude or  . Tobacco abuse     Past Surgical History:  Procedure Laterality Date  . ABDOMINAL AORTAGRAM N/A 01/29/2014   Procedure: ABDOMINAL Maxcine Ham;  Surgeon: Angelia Mould, MD;  Location: [facility] CATH LAB;  Service: Cardiovascular;   Laterality: N/A;  . ABDOMINAL AORTIC ENDOVASCULAR STENT GRAFT Bilateral 02/14/2014   Procedure: ABDOMINAL AORTIC ENDOVASCULAR STENT GRAFT- GORE;  Surgeon: Serafina Mitchell, MD;  Location: [facility] OR;  Service: Vascular;  Laterality: Bilateral;  . bilateral lower extremity stents     right common femoral and left external iliac  . CAROTID ENDARTERECTOMY Bilateral    bilateral, right in 2009 and left in 2001  . CHOLECYSTECTOMY    . COLONOSCOPY  08/09/2011   Procedure: COLONOSCOPY;  Surgeon: Daneil Dolin, MD;  [facility];  Service: Endoscopy;  Laterality: N/A;  9:15  . CORONARY ARTERY BYPASS GRAFT  2001   with a LIMA to his LAD, vein to an obtuse marginal branch and to the RCA  . HERNIA REPAIR     periumbilical  . INGUINAL HERNIA REPAIR     bilateral    Social History   Socioeconomic History  . Marital status: Single    Spouse name: Not on file  . Number of children: 0  . Years of education: Not on file  . Highest education level: Not on file  Occupational History    Employer: [employer]  Tobacco Use  . Smoking status: Former Smoker    Packs/day: 1.00    Years: 15.00    Pack years: 15.00    Types: Cigarettes    Quit date: 11/23/2003    Years since quitting: 17.2  . Smokeless tobacco: Never Used  .  Tobacco comment: quit 10 years ago  Vaping Use  . Vaping Use: Never used  Substance and Sexual Activity  . Alcohol use: Yes    Comment: occasional  . Drug use: No  . Sexual activity: Not on file  Other Topics Concern  . Not on file  Social History Narrative  . Not on file   Social Determinants of Health   Financial Resource Strain: Not on file  Food Insecurity: Not on file  Transportation Needs: Not on file  Physical Activity: Not on file  Stress: Not on file  Social Connections: Not on file  Intimate Partner Violence: Not on file   Family History  Problem Relation Age of Onset  . Heart attack Father   . Prostate cancer Father        mets to bone  .  Cancer Father   . Heart disease Father   . Cancer Mother        ?    Current Outpatient Medications  Medication Sig Dispense Refill  . Ascorbic Acid (VITAMIN C WITH ROSE HIPS) 1000 MG tablet TAKE 2 (TWO) TABLET BY MOUTH DAILY    . aspirin 81 MG tablet Take 81 mg by mouth daily.    Marland Kitchen atorvastatin (LIPITOR) 80 MG tablet TAKE 1 TABLET (80 MG TOTAL) BY MOUTH DAILY. 90 tablet 3  . budesonide-formoterol (SYMBICORT) 160-4.5 MCG/ACT inhaler Inhale into the lungs.    . calcium carbonate (TUMS - DOSED IN MG ELEMENTAL CALCIUM) 500 MG chewable tablet Chew 1,000 mg by mouth daily.    . clopidogrel (PLAVIX) 75 MG tablet Take 1 tablet (75 mg total) by mouth daily. NEED OFFICE VISIT FOR FURTHER REFILLS 30 tablet 0  . fluticasone (FLONASE) 50 MCG/ACT nasal spray Place 2 sprays into both nostrils daily. 16 g 2  . FLUZONE HIGH-DOSE 0.5 ML injection TO BE ADMINISTERED BY PHARMACIST FOR IMMUNIZATION  0  . folic acid (FOLVITE) 161 MCG tablet Take 400 mcg by mouth daily.    . methocarbamol (ROBAXIN) 500 MG tablet Take one tab PO QAM 10 tablet 0  . metoprolol tartrate (LOPRESSOR) 25 MG tablet TAKE 12.5MG  (1/2 TABLET) BY MOUTH IN THE MORNING AND 25MG  (1 TABLET) AT BEDTIME. 135 tablet 2  . Multiple Vitamin (MULTIVITAMIN) capsule Take 1 capsule by mouth daily.    Marland Kitchen zolpidem (AMBIEN) 10 MG tablet TAKE 1 TABLET BY MOUTH AT BEDTIME AS NEEDED FOR SLEEP.     No current facility-administered medications for this visit.    No Known Allergies   REVIEW OF SYSTEMS:   [X]  denotes positive finding, [ ]  denotes negative finding Cardiac  Comments:  Chest pain or chest pressure:    Shortness of breath upon exertion:    Short of breath when lying flat:    Irregular heart rhythm:        Vascular    Pain in calf, thigh, or hip brought on by ambulation:    Pain in feet at night that wakes you up from your sleep:     Blood clot in your veins:    Leg swelling:         Pulmonary    Oxygen at home:    Productive cough:      Wheezing:         Neurologic    Sudden weakness in arms or legs:     Sudden numbness in arms or legs:     Sudden onset of difficulty speaking or slurred speech:    Temporary loss  of vision in one eye:     Problems with dizziness:         Gastrointestinal    Blood in stool:     Vomited blood:         Genitourinary    Burning when urinating:     Blood in urine:        Psychiatric    Major depression:         Hematologic    Bleeding problems:    Problems with blood clotting too easily:        Skin    Rashes or ulcers:        Constitutional    Fever or chills:      PHYSICAL EXAMINATION:  Vitals:   02/09/21 1416 02/09/21 1420  BP: 123/70 125/68  Pulse: 70 69  Resp: 18   Temp: (!) 97.3 F (36.3 C)   TempSrc: Temporal   Weight: 174 lb (78.9 kg)   Height: 6\' 1"  (1.854 m)     General:  WDWN in NAD; vital signs documented above Gait: Not observed HENT: WNL, normocephalic Pulmonary: normal non-labored breathing , without Rales, rhonchi,  wheezing Cardiac: regular HR, without  Murmurs with left carotid bruit Skin: without rashes Vascular Exam/Pulses: Extremities: with chronic ischemic skin color changes, without Gangrene , without cellulitis; without open wounds;  Musculoskeletal: no muscle wasting or atrophy  Neurologic: A&O X 3;  No focal weakness or paresthesias are detected Psychiatric:  The pt has Normal affect.   Non-Invasive Vascular Imaging:   02/09/2021 Right Carotid: Velocities in the right ICA are consistent with a 1-39% stenosis.   Left Carotid: Velocities in the left ICA are consistent with a 60-79%  stenosis. Hemodynamically significant plaque >50% visualized in the  distal CCA/bulb.   Vertebrals: Bilateral vertebral arteries demonstrate antegrade flow.     ASSESSMENT/PLAN:: 74 y.o. male here for follow up for carotid artery stenosis.  The patient has no symptoms referable to carotid stenosis.  He is compliant with medication.  We reviewed  signs and symptoms of TIA/stroke and he is advised to call emergency medical services should these occur.  We reviewed his duplex results today.  There is an increase in the estimated left ICA stenosis.  We will repeat carotid duplex in 6 months.  Barbie Banner, PA-C [facility] [phone]  Clinic MD:   Trula Slade

## 2021-02-11 ENCOUNTER — Other Ambulatory Visit: Payer: Self-pay

## 2021-02-11 DIAGNOSIS — I6529 Occlusion and stenosis of unspecified carotid artery: Secondary | ICD-10-CM

## 2021-04-25 ENCOUNTER — Other Ambulatory Visit: Payer: Self-pay

## 2021-04-25 DIAGNOSIS — I714 Abdominal aortic aneurysm, without rupture, unspecified: Secondary | ICD-10-CM

## 2021-04-25 DIAGNOSIS — I739 Peripheral vascular disease, unspecified: Secondary | ICD-10-CM

## 2021-04-25 NOTE — Addendum Note (Signed)
Addended byDoylene Bode on: 04/25/2021 09:26 AM   Modules accepted: Orders

## 2021-08-19 ENCOUNTER — Telehealth (HOSPITAL_COMMUNITY): Payer: Self-pay | Admitting: Surgery

## 2021-08-19 NOTE — Telephone Encounter (Signed)
Spoke with patient, they informed that we are out of network for their insurance, making it more expinsive for them to been seen here. They are thinking about seeing another doctor/provider.
